# Patient Record
Sex: Male | Born: 1985 | Race: White | Hispanic: No | Marital: Single | Smoking: Never smoker
Health system: Southern US, Community
[De-identification: ages and names within clinical notes are randomized; demographics above are authoritative.]

---

## 2016-01-24 ENCOUNTER — Emergency Department: Payer: Managed Care, Other (non HMO)

## 2016-01-24 ENCOUNTER — Emergency Department
Admission: EM | Admit: 2016-01-24 | Discharge: 2016-01-24 | Disposition: A | Discharge status: Home / Self Care | Payer: Managed Care, Other (non HMO) | Attending: Emergency Medicine | Admitting: Emergency Medicine

## 2016-01-24 DIAGNOSIS — R109 Unspecified abdominal pain: Secondary | ICD-10-CM | POA: Diagnosis not present

## 2016-01-24 LAB — URINALYSIS COMPLETE WITH MICROSCOPIC (ARMC ONLY)
BACTERIA UA: NONE SEEN
Bilirubin Urine: NEGATIVE
Glucose, UA: NEGATIVE mg/dL
Hgb urine dipstick: NEGATIVE
KETONES UR: NEGATIVE mg/dL
Leukocytes, UA: NEGATIVE
Nitrite: NEGATIVE
PROTEIN: NEGATIVE mg/dL
Specific Gravity, Urine: 1.009 (ref 1.005–1.030)
pH: 6 (ref 5.0–8.0)

## 2016-01-24 LAB — COMPREHENSIVE METABOLIC PANEL
ALT: 63 U/L (ref 17–63)
AST: 40 U/L (ref 15–41)
Albumin: 5.1 g/dL — ABNORMAL HIGH (ref 3.5–5.0)
Alkaline Phosphatase: 85 U/L (ref 38–126)
Anion gap: 8 (ref 5–15)
BILIRUBIN TOTAL: 1.8 mg/dL — AB (ref 0.3–1.2)
BUN: 13 mg/dL (ref 6–20)
CHLORIDE: 103 mmol/L (ref 101–111)
CO2: 26 mmol/L (ref 22–32)
CREATININE: 0.85 mg/dL (ref 0.61–1.24)
Calcium: 9.8 mg/dL (ref 8.9–10.3)
GFR calc Af Amer: >60 mL/min (ref >60)
Glucose, Bld: 87 mg/dL (ref 65–99)
Potassium: 3.8 mmol/L (ref 3.5–5.1)
Sodium: 137 mmol/L (ref 135–145)
Total Protein: 8.2 g/dL — ABNORMAL HIGH (ref 6.5–8.1)

## 2016-01-24 LAB — CBC
HEMATOCRIT: 44.6 % (ref 40.0–52.0)
Hemoglobin: 15.5 g/dL (ref 13.0–18.0)
MCH: 30.9 pg (ref 26.0–34.0)
MCHC: 34.9 g/dL (ref 32.0–36.0)
MCV: 88.5 fL (ref 80.0–100.0)
Platelets: 285 10*3/uL (ref 150–440)
RBC: 5.04 MIL/uL (ref 4.40–5.90)
RDW: 12.9 % (ref 11.5–14.5)
WBC: 7.8 10*3/uL (ref 3.8–10.6)

## 2016-01-24 LAB — LIPASE, BLOOD: LIPASE: 23 U/L (ref 11–51)

## 2016-01-24 MED ORDER — TRAMADOL HCL 50 MG PO TABS: 50.0000 mg | ORAL_TABLET | Freq: Four times a day (QID) | ORAL | Status: DC | PRN

## 2016-01-24 MED ORDER — CYCLOBENZAPRINE HCL 10 MG PO TABS: 10.0000 mg | ORAL_TABLET | Freq: Three times a day (TID) | ORAL | Status: DC | PRN

## 2016-01-24 NOTE — ED Notes (Signed)
Pt states R sided chest pain x 2 years, went away and came back Thursday. States it feels "like someone stabbed me in the chest". States R sided abdominal pain. Denies radiation. States when pain increases he becomes nauseous, but denies fevers, vomiting, or diarrhea. Pt is alert and oriented, skin warm and dry.

## 2016-01-24 NOTE — ED Notes (Signed)
Pt in with co right sided abd pain and right breast pain. States started Thursday, breast pain is worse on palpation and on movement. States has had denies any n.v.d but does have dark colored urine.

## 2016-01-24 NOTE — ED Provider Notes (Signed)
[facility] Emergency Department Provider Note        Time seen: ----------------------------------------- 8:39 PM on 01/24/2016 -----------------------------------------    I have reviewed the triage vital signs and the nursing notes.   HISTORY  Chief Complaint Abdominal Pain    HPI Jon Cooper is a 30 y.o. male who presents to ER for right-sided chest and abdominal pain. Patient states pain is intermittent, feels like someone stabbed him on the right side. He states the pain increased to becomes nauseous, denies fevers, vomiting or diarrhea. Patient states he does have dark-colored urine.   No past medical history on file.  There are no active problems to display for this patient.   No past surgical history on file.  Allergies Review of patient's allergies indicates no known allergies.  Social History Social History  Substance Use Topics  . Smoking status: Not on file  . Smokeless tobacco: Not on file  . Alcohol Use: Not on file    Review of Systems Constitutional: Negative for fever. Cardiovascular: Positive chest pain Respiratory: Negative for shortness of breath. Gastrointestinal: Positive for abdominal pain Genitourinary: Negative for dysuria. Musculoskeletal: Negative for back pain. Skin: Negative for rash. Neurological: Negative for headaches, focal weakness or numbness.  10-point ROS otherwise negative.  ____________________________________________   PHYSICAL EXAM:  VITAL SIGNS: ED Triage Vitals  Enc Vitals Group     BP 01/24/16 1929 154/82 mmHg     Pulse Rate 01/24/16 1928 76     Resp 01/24/16 1928 18     Temp 01/24/16 1928 98 F (36.7 C)     Temp Source 01/24/16 1928 Oral     SpO2 01/24/16 1928 97 %     Weight 01/24/16 1928 198 lb (89.812 kg)     Height 01/24/16 1928 5\' 8"  (1.727 m)     Head Cir --      Peak Flow --      Pain Score 01/24/16 1928 5     Pain Loc --      Pain Edu? --      Excl. in GC? --      Constitutional: Alert and oriented. Well appearing and in no distress. Eyes: Conjunctivae are normal. PERRL. Normal extraocular movements. ENT   Head: Normocephalic and atraumatic.   Nose: No congestion/rhinnorhea.   Mouth/Throat: Mucous membranes are moist.   Neck: No stridor. Cardiovascular: Normal rate, regular rhythm. No murmurs, rubs, or gallops. Respiratory: Normal respiratory effort without tachypnea nor retractions. Breath sounds are clear and equal bilaterally. No wheezes/rales/rhonchi. Gastrointestinal: Soft and nontender. Normal bowel sounds Musculoskeletal: Nontender with normal range of motion in all extremities. No lower extremity tenderness nor edema. Neurologic:  Normal speech and language. No gross focal neurologic deficits are appreciated.  Skin:  Skin is warm, dry and intact. No rash noted. Psychiatric: Mood and affect are normal. Speech and behavior are normal.  ____________________________________________  ED COURSE:  Pertinent labs & imaging results that were available during my care of the patient were reviewed by me and considered in my medical decision making (see chart for details). Patient is in no acute distress, will check basic labs and imaging. ____________________________________________    LABS (pertinent positives/negatives)  Labs Reviewed  COMPREHENSIVE METABOLIC PANEL - Abnormal; Notable for the following:    Total Protein 8.2 (*)    Albumin 5.1 (*)    Total Bilirubin 1.8 (*)    All other components within normal limits  URINALYSIS COMPLETEWITH MICROSCOPIC ([facility] ONLY) - Abnormal; Notable for the  following:    Color, Urine STRAW (*)    APPearance CLEAR (*)    Squamous Epithelial / LPF 0-5 (*)    All other components within normal limits  CBC  LIPASE, BLOOD    RADIOLOGY Images were viewed by me  CT renal protocol IMPRESSION: 1. No CT evidence for nephrolithiasis or obstructive uropathy. 2. No other acute  intra-abdominal or pelvic process. Normal appendix. ____________________________________________  FINAL ASSESSMENT AND PLAN  Flank pain  Plan: Patient with labs and imaging as dictated above. Patient with acute on chronic flank pain of uncertain etiology. I will prescribe tramadol and Flexeril, advised close follow-up with his doctor for reevaluation.   Emily FilbertWilliams, Jonathan E, MD   Note: This dictation was prepared with Dragon dictation. Any transcriptional errors that result from this process are unintentional   Emily FilbertJonathan E Williams, MD 01/24/16 2132

## 2016-01-24 NOTE — ED Notes (Signed)
Pt discharged to home.  Discharge instructions reviewed.  Verbalized understanding.  No questions or concerns at this time.  Teach back verified.  Pt in NAD.  No items left in ED.   

## 2016-01-24 NOTE — ED Notes (Signed)
Pt ambulatory from CT, back in room now.

## 2016-01-24 NOTE — Discharge Instructions (Signed)
Flank Pain °Flank pain refers to pain that is located on the side of the body between the upper abdomen and the back. The pain may occur over a short period of time (acute) or may be long-term or reoccurring (chronic). It may be mild or severe. Flank pain can be caused by many things. °CAUSES  °Some of the more common causes of flank pain include: °· Muscle strains.   °· Muscle spasms.   °· A disease of your spine (vertebral disk disease).   °· A lung infection (pneumonia).   °· Fluid around your lungs (pulmonary edema).   °· A kidney infection.   °· Kidney stones.   °· A very painful skin rash caused by the chickenpox virus (shingles).   °· Gallbladder disease.   °HOME CARE INSTRUCTIONS  °Home care will depend on the cause of your pain. In general, °· Rest as directed by your caregiver. °· Drink enough fluids to keep your urine clear or pale yellow. °· Only take over-the-counter or prescription medicines as directed by your caregiver. Some medicines may help relieve the pain. °· Tell your caregiver about any changes in your pain. °· Follow up with your caregiver as directed. °SEEK IMMEDIATE MEDICAL CARE IF:  °· Your pain is not controlled with medicine.   °· You have new or worsening symptoms. °· Your pain increases.   °· You have abdominal pain.   °· You have shortness of breath.   °· You have persistent nausea or vomiting.   °· You have swelling in your abdomen.   °· You feel faint or pass out.   °· You have blood in your urine. °· You have a fever or persistent symptoms for more than 2-3 days. °· You have a fever and your symptoms suddenly get worse. °MAKE SURE YOU:  °· Understand these instructions. °· Will watch your condition. °· Will get help right away if you are not doing well or get worse. °  °This information is not intended to replace advice given to you by your health care provider. Make sure you discuss any questions you have with your health care provider. °  °Document Released: 08/29/2005 Document  Revised: 04/01/2012 Document Reviewed: 02/20/2012 °Elsevier Interactive Patient Education ©2016 [employer] ° °

## 2016-02-13 ENCOUNTER — Ambulatory Visit: Payer: Self-pay | Admitting: Family Medicine

## 2016-05-28 ENCOUNTER — Emergency Department: Payer: Managed Care, Other (non HMO)

## 2016-05-28 ENCOUNTER — Encounter: Payer: Self-pay | Admitting: Emergency Medicine

## 2016-05-28 ENCOUNTER — Inpatient Hospital Stay
Admission: EM | Admit: 2016-05-28 | Discharge: 2016-05-31 | DRG: 392 | Disposition: A | Discharge status: Home / Self Care | Payer: Managed Care, Other (non HMO) | Attending: Internal Medicine | Admitting: Internal Medicine

## 2016-05-28 DIAGNOSIS — K567 Ileus, unspecified: Secondary | ICD-10-CM | POA: Diagnosis present

## 2016-05-28 DIAGNOSIS — R109 Unspecified abdominal pain: Secondary | ICD-10-CM

## 2016-05-28 DIAGNOSIS — Z6832 Body mass index (BMI) 32.0-32.9, adult: Secondary | ICD-10-CM | POA: Diagnosis not present

## 2016-05-28 DIAGNOSIS — R1011 Right upper quadrant pain: Secondary | ICD-10-CM | POA: Diagnosis present

## 2016-05-28 DIAGNOSIS — K760 Fatty (change of) liver, not elsewhere classified: Secondary | ICD-10-CM | POA: Diagnosis present

## 2016-05-28 DIAGNOSIS — E669 Obesity, unspecified: Secondary | ICD-10-CM | POA: Diagnosis present

## 2016-05-28 DIAGNOSIS — R740 Nonspecific elevation of levels of transaminase and lactic acid dehydrogenase [LDH]: Secondary | ICD-10-CM | POA: Diagnosis present

## 2016-05-28 DIAGNOSIS — R55 Syncope and collapse: Secondary | ICD-10-CM | POA: Diagnosis present

## 2016-05-28 DIAGNOSIS — K56609 Unspecified intestinal obstruction, unspecified as to partial versus complete obstruction: Secondary | ICD-10-CM | POA: Diagnosis present

## 2016-05-28 DIAGNOSIS — R0789 Other chest pain: Secondary | ICD-10-CM | POA: Diagnosis present

## 2016-05-28 LAB — HEPATIC FUNCTION PANEL
ALBUMIN: 4.6 g/dL (ref 3.5–5.0)
ALK PHOS: 87 U/L (ref 38–126)
ALT: 126 U/L — ABNORMAL HIGH (ref 17–63)
AST: 68 U/L — AB (ref 15–41)
BILIRUBIN TOTAL: 1.3 mg/dL — AB (ref 0.3–1.2)
Bilirubin, Direct: <0.1 mg/dL — ABNORMAL LOW (ref 0.1–0.5)
Total Protein: 7.5 g/dL (ref 6.5–8.1)

## 2016-05-28 LAB — URINALYSIS COMPLETE WITH MICROSCOPIC (ARMC ONLY)
Bacteria, UA: NONE SEEN
Bilirubin Urine: NEGATIVE
Glucose, UA: NEGATIVE mg/dL
Hgb urine dipstick: NEGATIVE
Ketones, ur: NEGATIVE mg/dL
Leukocytes, UA: NEGATIVE
Nitrite: NEGATIVE
PROTEIN: NEGATIVE mg/dL
Specific Gravity, Urine: 1.009 (ref 1.005–1.030)
pH: 6 (ref 5.0–8.0)

## 2016-05-28 LAB — CBC
HEMATOCRIT: 44 % (ref 40.0–52.0)
Hemoglobin: 15.4 g/dL (ref 13.0–18.0)
MCH: 30.6 pg (ref 26.0–34.0)
MCHC: 35 g/dL (ref 32.0–36.0)
MCV: 87.5 fL (ref 80.0–100.0)
Platelets: 287 10*3/uL (ref 150–440)
RBC: 5.03 MIL/uL (ref 4.40–5.90)
RDW: 12.9 % (ref 11.5–14.5)
WBC: 5.8 10*3/uL (ref 3.8–10.6)

## 2016-05-28 LAB — BASIC METABOLIC PANEL
Anion gap: 7 (ref 5–15)
BUN: 13 mg/dL (ref 6–20)
CHLORIDE: 103 mmol/L (ref 101–111)
CO2: 27 mmol/L (ref 22–32)
Calcium: 9.4 mg/dL (ref 8.9–10.3)
Creatinine, Ser: 0.76 mg/dL (ref 0.61–1.24)
GFR calc Af Amer: >60 mL/min (ref >60)
GFR calc non Af Amer: >60 mL/min (ref >60)
Glucose, Bld: 122 mg/dL — ABNORMAL HIGH (ref 65–99)
POTASSIUM: 3.9 mmol/L (ref 3.5–5.1)
SODIUM: 137 mmol/L (ref 135–145)

## 2016-05-28 LAB — LIPASE, BLOOD: Lipase: 27 U/L (ref 11–51)

## 2016-05-28 LAB — FIBRIN DERIVATIVES D-DIMER (ARMC ONLY): FIBRIN DERIVATIVES D-DIMER (ARMC): 203 (ref 0–499)

## 2016-05-28 LAB — TROPONIN I: Troponin I: <0.03 ng/mL (ref <0.03)

## 2016-05-28 MED ORDER — KCL IN DEXTROSE-NACL 20-5-0.9 MEQ/L-%-% IV SOLN
INTRAVENOUS | Status: DC
  Administered 2016-05-28 – 2016-05-30 (×6): via INTRAVENOUS
  Filled 2016-05-28 (×9): qty 1000

## 2016-05-28 MED ORDER — ENOXAPARIN SODIUM 40 MG/0.4ML ~~LOC~~ SOLN
40.0000 mg | SUBCUTANEOUS | Status: DC
  Administered 2016-05-28 – 2016-05-30 (×3): 40 mg via SUBCUTANEOUS
  Filled 2016-05-28 (×3): qty 0.4

## 2016-05-28 MED ORDER — ACETAMINOPHEN 325 MG PO TABS
650.0000 mg | ORAL_TABLET | Freq: Four times a day (QID) | ORAL | Status: DC | PRN
  Administered 2016-05-29 – 2016-05-30 (×2): 650 mg via ORAL
  Filled 2016-05-28 (×2): qty 2

## 2016-05-28 MED ORDER — ONDANSETRON HCL 4 MG PO TABS: 4.0000 mg | ORAL_TABLET | Freq: Four times a day (QID) | ORAL | Status: DC | PRN

## 2016-05-28 MED ORDER — IOPAMIDOL (ISOVUE-300) INJECTION 61%
100.0000 mL | Freq: Once | INTRAVENOUS | Status: AC | PRN
  Administered 2016-05-28: 100 mL via INTRAVENOUS

## 2016-05-28 MED ORDER — MORPHINE SULFATE (PF) 2 MG/ML IV SOLN
4.0000 mg | Freq: Once | INTRAVENOUS | Status: AC
  Administered 2016-05-28: 4 mg via INTRAVENOUS
  Filled 2016-05-28: qty 2

## 2016-05-28 MED ORDER — KETOROLAC TROMETHAMINE 30 MG/ML IJ SOLN
30.0000 mg | Freq: Four times a day (QID) | INTRAMUSCULAR | Status: DC | PRN
  Administered 2016-05-28 – 2016-05-29 (×3): 30 mg via INTRAVENOUS
  Filled 2016-05-28 (×3): qty 1

## 2016-05-28 MED ORDER — SODIUM CHLORIDE 0.9 % IV BOLUS (SEPSIS)
1000.0000 mL | Freq: Once | INTRAVENOUS | Status: AC
  Administered 2016-05-28: 1000 mL via INTRAVENOUS

## 2016-05-28 MED ORDER — FAMOTIDINE IN NACL 20-0.9 MG/50ML-% IV SOLN
20.0000 mg | INTRAVENOUS | Status: DC
  Administered 2016-05-28 – 2016-05-30 (×3): 20 mg via INTRAVENOUS
  Filled 2016-05-28 (×4): qty 50

## 2016-05-28 MED ORDER — IOPAMIDOL (ISOVUE-300) INJECTION 61%
30.0000 mL | Freq: Once | INTRAVENOUS | Status: AC | PRN
  Administered 2016-05-28: 30 mL via ORAL

## 2016-05-28 MED ORDER — ONDANSETRON HCL 4 MG/2ML IJ SOLN
4.0000 mg | Freq: Once | INTRAMUSCULAR | Status: AC
  Administered 2016-05-28: 4 mg via INTRAVENOUS
  Filled 2016-05-28: qty 2

## 2016-05-28 MED ORDER — ACETAMINOPHEN 650 MG RE SUPP: 650.0000 mg | Freq: Four times a day (QID) | RECTAL | Status: DC | PRN

## 2016-05-28 MED ORDER — ONDANSETRON HCL 4 MG/2ML IJ SOLN: 4.0000 mg | Freq: Four times a day (QID) | INTRAMUSCULAR | Status: DC | PRN

## 2016-05-28 NOTE — ED Notes (Signed)
Pt reports right side chest pain for 1 year.  No n/v/d.  Pt reports intermittent sob.  No cough .  No fever.  Nonsmoker   Pt alert.  md at bedside on arrival.  nsr on monitor.

## 2016-05-28 NOTE — ED Notes (Signed)
Pt resting quietly   Chest pain improved.  Pt alert.  Skin warm and dry.  Iv in place.  nsr on monitor.

## 2016-05-28 NOTE — ED Notes (Signed)
Report called to [facility] 2nd floor.

## 2016-05-28 NOTE — H&P (Signed)
[facility] -  at [facility]   PATIENT NAME: Jon HaleJohn Cooper    MR#:  [id number]  DATE OF BIRTH:  [birth year]  DATE OF ADMISSION:  05/28/2016  PRIMARY CARE PHYSICIAN: No primary care provider on file.   REQUESTING/REFERRING PHYSICIAN: Nita Sicklearolina Veronese, MD  CHIEF COMPLAINT:  Acute rt abd pain and nausea  HISTORY OF PRESENT ILLNESS:  Jon HaleJohn Cooper  is a 30 y.o. male with a  history of No medical problems is presenting to the ED with a chief complaint of right-sided abdominal pain for the past one week associated with nausea. Denies any vomiting. Denies any diarrhea or constipation. The pain is 8-9 out of 10 with radiation to rt chest. Patient also reports that he has chronic right-sided abdominal pain for many years that has gotten worse in the past one week. Denies any fever or sick contacts. The patient reports that pain is so severe, he passed out once last week. He was seen by his primary care physician who referred him to gastroenterology and had endoscopy which was Negative. Patient also had a right upper quadrant ultrasound last Friday. Patient came into the emergency department CT abdomen has revealed borderline dilated distal small bowel loops within the central upper pelvis with questionable transition to decompressed distal small bowel, early or developing small bowel obstruction cannot be ruled out. Patient's last bowel movement is today a.m. denies any blood in stool  PAST MEDICAL HISTORY:  History reviewed. No pertinent past medical history.  PAST SURGICAL HISTOIRY:  History reviewed. No pertinent surgical history.  SOCIAL HISTORY:   Social History  Substance Use Topics  . Smoking status: Never Smoker  . Smokeless tobacco: Never Used  . Alcohol use No    FAMILY HISTORY:  No family history on file.  DRUG ALLERGIES:  No Known Allergies  REVIEW OF SYSTEMS:  CONSTITUTIONAL: No fever, fatigue or weakness.  EYES: No blurred or double  vision.  EARS, NOSE, AND THROAT: No tinnitus or ear pain.  RESPIRATORY: No cough, shortness of breath, wheezing or hemoptysis.  CARDIOVASCULAR: No chest pain, orthopnea, edema. Reporting dizziness when he has severe abdominal pain GASTROINTESTINAL: Reporting right-sided abdominal pain and nausea, denies vomiting, had a diarrhea last week which is resolved .  GENITOURINARY: No dysuria, hematuria.  ENDOCRINE: No polyuria, nocturia,  HEMATOLOGY: No anemia, easy bruising or bleeding SKIN: No rash or lesion. MUSCULOSKELETAL: No joint pain or arthritis.   NEUROLOGIC: No tingling, numbness, weakness.  PSYCHIATRY: No anxiety or depression.   MEDICATIONS AT HOME:   Prior to Admission medications   Medication Sig Start Date End Date Taking? Authorizing Provider  omeprazole (PRILOSEC) 40 MG capsule Take 40 mg by mouth daily.   Yes Historical Provider, MD  sucralfate (CARAFATE) 1 g tablet Take 1 g by mouth 4 (four) times daily.   Yes Historical Provider, MD      VITAL SIGNS:  Blood pressure (!) 142/87, pulse 64, temperature 98.2 F (36.8 C), temperature source Oral, resp. rate 19, height 5\' 8"  (1.727 m), weight 90.7 kg (200 lb), SpO2 94 %.  PHYSICAL EXAMINATION:  GENERAL:  30 y.o.-year-old patient lying in the bed with no acute distress.  EYES: Pupils equal, round, reactive to light and accommodation. No scleral icterus. Extraocular muscles intact.  HEENT: Head atraumatic, normocephalic. Oropharynx and nasopharynx clear.  NECK:  Supple, no jugular venous distention. No thyroid enlargement, no tenderness.  LUNGS: Normal breath sounds bilaterally, no wheezing, rales,rhonchi or crepitation. No use of accessory muscles of respiration.  CARDIOVASCULAR: S1, S2 normal. No murmurs, rubs, or gallops.  ABDOMEN: Soft, right upper quadrant is tender, distended. Hypoactive Bowel sounds presen in 3 quadrants patient being is guarding on the right upper quadrant area. EXTREMITIES: No pedal edema, cyanosis, or  clubbing.  NEUROLOGIC: Cranial nerves II through XII are intact. Muscle strength 5/5 in all extremities. Sensation intact. Gait not checked.  PSYCHIATRIC: The patient is alert and oriented x 3.  SKIN: No obvious rash, lesion, or ulcer.   LABORATORY PANEL:   CBC  Recent Labs Lab 05/28/16 1312  WBC 5.8  HGB 15.4  HCT 44.0  PLT 287   ------------------------------------------------------------------------------------------------------------------  Chemistries   Recent Labs Lab 05/28/16 1312  NA 137  K 3.9  CL 103  CO2 27  GLUCOSE 122*  BUN 13  CREATININE 0.76  CALCIUM 9.4  AST 68*  ALT 126*  ALKPHOS 87  BILITOT 1.3*   ------------------------------------------------------------------------------------------------------------------  Cardiac Enzymes  Recent Labs Lab 05/28/16 1600  TROPONINI <0.03   ------------------------------------------------------------------------------------------------------------------  RADIOLOGY:  Dg Chest 2 View  Result Date: 05/28/2016 CLINICAL DATA:  Right-sided chest pain and shortness of breath for 2 months EXAM: CHEST  2 VIEW COMPARISON:  None. FINDINGS: The heart size and mediastinal contours are within normal limits. Both lungs are clear. The visualized skeletal structures are unremarkable. IMPRESSION: No active cardiopulmonary disease. Electronically Signed   By: Alcide Clever M.D.   On: 05/28/2016 13:52   Ct Abdomen Pelvis W Contrast  Result Date: 05/28/2016 CLINICAL DATA:  Right-sided chest pain to the right lower quadrant with nausea and dizziness EXAM: CT ABDOMEN AND PELVIS WITH CONTRAST TECHNIQUE: Multidetector CT imaging of the abdomen and pelvis was performed using the standard protocol following bolus administration of intravenous contrast. CONTRAST:  ISOVUE-300 IOPAMIDOL (ISOVUE-300) INJECTION 61% COMPARISON:  01/24/2016 FINDINGS: Lower chest: Visualized lung bases show no acute infiltrate or effusion. Hepatobiliary:  Mild decreased density of the liver suggests fatty change. No focal hepatic abnormality. Gallbladder is contracted. No biliary dilatation. Pancreas: Unremarkable. No pancreatic ductal dilatation or surrounding inflammatory changes. Spleen: Normal in size without focal abnormality. Adrenals/Urinary Tract: Adrenal glands are unremarkable. Kidneys are normal, without renal calculi, focal lesion, or hydronephrosis. Bladder is unremarkable. Stomach/Bowel: Stomach is unremarkable. Borderline dilated mid small bowel loops in the upper pelvis with questionable transition to decompressed distal small bowel, series 2, image number 55. Appendix is visualized and is normal. Vascular/Lymphatic: No significant vascular findings are present. No enlarged abdominal or pelvic lymph nodes. Reproductive: Prostate is unremarkable. Other: No free air or free fluid Musculoskeletal: No acute or significant osseous findings. IMPRESSION: 1. Borderline dilated distal small bowel loops within the central upper pelvis with questionable transition to decompressed distal small bowel, early or developing small bowel obstruction cannot be ruled out. 2. Fatty liver Electronically Signed   By: Jasmine Pang M.D.   On: 05/28/2016 17:38   US Abdomen Limited Ruq  Result Date: 05/28/2016 CLINICAL DATA:  Right upper quadrant pain EXAM: US ABDOMEN LIMITED - RIGHT UPPER QUADRANT COMPARISON:  CT 01/24/2016 FINDINGS: Gallbladder: No gallstones or wall thickening visualized. No sonographic Murphy sign noted by sonographer. Common bile duct: Diameter: 3 mm Liver: 1.8 cm area of decreased echogenicity adjacent to the gallbladder fossa may represent fatty sparing. The liver shows diffuse increased echogenicity compatible with fatty infiltration. IMPRESSION: Negative for gallstones Fatty liver with probable fatty sparing adjacent to the gallbladder fossa. Electronically Signed   By: Marlan Palau M.D.   On: 05/28/2016 16:03    EKG:  Orders placed or  performed during the hospital encounter of 05/28/16  . ED EKG within 10 minutes  . ED EKG within 10 minutes    IMPRESSION AND PLAN:   Jon Cooper  is a 30 y.o. male with a  history of No medical problems is presenting to the ED with a chief complaint of right-sided abdominal pain for the past one week associated with nausea. Denies any vomiting  Assessment and plan  #Acute on chronic right abdominal pain with transaminitis-could be post viral as patient had gastroenteritis last week CT abdomen with possible early small bowel obstruction Ultrasound of the abdomen with fatty liver, no gallstones Admit to MedSurg unit Nothing by mouth and IV fluids Provide pain management as needed ng tube placement for decompression as needed if patient starts vomiting -Surgery consult is placed -Supportive therapy, conservative management for now -Repeat a.m. labs  #Syncope-vasovagal syncope secondary to pain Patient reports dizzy spells and syncopal episode when he has severe abdominal pain   #Chest pain atypical -probably radiation from the abdominal pain  Troponins 2 are negative and ruled out acute MI Fibrin derivatives are  203  #Obesity and fatty liver Lifestyle changes recommended once patient is medically stable  GI prophylaxis with Pepcid   All the records are reviewed and case discussed with ED provider. Management plans discussed with the patient, and he is  in agreement.  CODE STATUS: fc, mom HCPOA  TOTAL TIME TAKING CARE OF THIS PATIENT: 41 minutes.   Note: This dictation was prepared with Dragon dictation along with smaller phrase technology. Any transcriptional errors that result from this process are unintentional.  Ramonita LabGouru, Shanedra Lave M.D on 05/28/2016 at 7:15 PM  Between 7am to 6pm - Pager - [phone]  After 6pm go to [email or web address] - password [email or web address] [facility]  Office  [phone]  CC: Primary care physician; No primary care provider on  file.

## 2016-05-28 NOTE — ED Provider Notes (Signed)
[facility] Emergency Department Provider Note  ____________________________________________  Time seen: Approximately 3:35 PM  I have reviewed the triage vital signs and the nursing notes.   HISTORY  Chief Complaint Chest Pain and Dizziness   HPI Jon Cooper is a 30 y.o. male no significant past medical history presents for evaluation of right sided abdominal pain. Patient has been having intermittent episodes of pain over the course of the last year. For the last week he reports that the pain is happening 4-5 times a day. He describes the pain as a sharp cramping sensation, located in his right abdomen, radiating to his right chest, pleuritic in nature, associated with nausea, diaphoresis, bloating, and shortness of breath. Patient reports that sometimes the pain is so severe that he has passed out. He reports he last passed out last week. Has been seen by his primary care doctor who refer him to GI specialist. He underwent an endoscopy which was negative. According to patient he underwent a right upper quadrant ultrasound on Friday however does not have the results of that test. Patient denies any personal or family history of ischemic heart disease, blood clots, recent travel or immobilization, leg pain or swelling, history of exogenous hormones, hemoptysis. Patient is not a smoker. He reports that the pain today has been present the whole day and is currently 8 out of 10 which prompted his visit to the ED. He also has had nausea but no vomiting. Had diarrhea early in the week but that has resolved.  History reviewed. No pertinent past medical history.  There are no active problems to display for this patient.   History reviewed. No pertinent surgical history.  Prior to Admission medications   Medication Sig Start Date End Date Taking? Authorizing Provider  cyclobenzaprine (FLEXERIL) 10 MG tablet Take 1 tablet (10 mg total) by mouth 3 (three) times daily as  needed for muscle spasms. 01/24/16   Emily FilbertJonathan E Williams, MD  traMADol (ULTRAM) 50 MG tablet Take 1 tablet (50 mg total) by mouth every 6 (six) hours as needed. 01/24/16 01/23/17  Emily FilbertJonathan E Williams, MD    Allergies Patient has no known allergies.  No family history on file.  Social History Social History  Substance Use Topics  . Smoking status: Never Smoker  . Smokeless tobacco: Never Used  . Alcohol use No    Review of Systems  Constitutional: Negative for fever. Eyes: Negative for visual changes. ENT: Negative for sore throat. Cardiovascular: Negative for chest pain. Respiratory: Negative for shortness of breath. Gastrointestinal: + RUQ abdominal pain and nausea, diarrhea. No vomiting  Genitourinary: Negative for dysuria. Musculoskeletal: Negative for back pain. Skin: Negative for rash. Neurological: Negative for headaches, weakness or numbness.  ____________________________________________   PHYSICAL EXAM:  VITAL SIGNS: ED Triage Vitals  Enc Vitals Group     BP 05/28/16 1315 (!) 154/79     Pulse Rate 05/28/16 1315 68     Resp 05/28/16 1315 16     Temp 05/28/16 1315 98.2 F (36.8 C)     Temp Source 05/28/16 1315 Oral     SpO2 05/28/16 1315 96 %     Weight 05/28/16 1307 198 lb (89.8 kg)     Height 05/28/16 1315 5\' 8"  (1.727 m)     Head Circumference --      Peak Flow --      Pain Score 05/28/16 1307 7     Pain Loc --      Pain Edu? --  Excl. in GC? --     Constitutional: Alert and oriented. Well appearing and in no apparent distress. HEENT:      Head: Normocephalic and atraumatic.         Eyes: Conjunctivae are normal. Sclera is non-icteric. EOMI. PERRL      Mouth/Throat: Mucous membranes are moist.       Neck: Supple with no signs of meningismus. Cardiovascular: Regular rate and rhythm. No murmurs, gallops, or rubs. 2+ symmetrical distal pulses are present in all extremities. No JVD. Respiratory: Normal respiratory effort. Lungs are clear to  auscultation bilaterally. No wheezes, crackles, or rhonchi.  Gastrointestinal: Soft, ttp over the RUQ with negative Murphy sign, non distended with positive bowel sounds. No rebound or guarding. Genitourinary: No CVA tenderness. Musculoskeletal: Nontender with normal range of motion in all extremities. No edema, cyanosis, or erythema of extremities. Neurologic: Normal speech and language. Face is symmetric. Moving all extremities. No gross focal neurologic deficits are appreciated. Skin: Skin is warm, dry and intact. No rash noted. Psychiatric: Mood and affect are normal. Speech and behavior are normal.  ____________________________________________   LABS (all labs ordered are listed, but only abnormal results are displayed)  Labs Reviewed  BASIC METABOLIC PANEL - Abnormal; Notable for the following:       Result Value   Glucose, Bld 122 (*)    All other components within normal limits  HEPATIC FUNCTION PANEL - Abnormal; Notable for the following:    AST 68 (*)    ALT 126 (*)    Total Bilirubin 1.3 (*)    Bilirubin, Direct <0.1 (*)    All other components within normal limits  CBC  TROPONIN I  LIPASE, BLOOD  FIBRIN DERIVATIVES D-DIMER ([facility] ONLY)  TROPONIN I  URINALYSIS COMPLETEWITH MICROSCOPIC ([facility] ONLY)   ____________________________________________  EKG  ED ECG REPORT I, Nita Sickle, the attending physician, personally viewed and interpreted this ECG. Normal sinus rhythm, rate of 63, normal intervals, normal axis, no ST elevations or depressions. Patient does have S1 QT T3 on his EKG. No prior for comparison  ____________________________________________  RADIOLOGY  CXR: negative  RUQ [place]: Negative for gallstones Fatty liver with probable fatty sparing adjacent to the gallbladder fossa.  CT a/p: 1. Borderline dilated distal small bowel loops within the central upper pelvis with questionable transition to decompressed distal small bowel, early or developing  small bowel obstruction cannot be ruled out. 2. Fatty liver ____________________________________________   PROCEDURES  Procedure(s) performed: None Procedures Critical Care performed:  None ____________________________________________   INITIAL IMPRESSION / ASSESSMENT AND PLAN / ED COURSE  30 y.o. male no significant past medical history presents for evaluation of right upper quadrant abdominal pain. Patient is well-appearing, in no distress, vital signs are within normal limits, patient has mild tenderness palpation on the right upper quadrant with no rebound or guarding, negative Murphy sign. Differential diagnoses including gallbladder pathology versus esophageal spasms versus peptic ulcer disease versus pancreatitis versus PE. Will give IV morphine, IV zofran, IV fluids. We'll check labs, right upper quadrant ultrasound and a d-dimer.  Clinical Course as of May 28 1752  Tue May 28, 2016  1747 CT concerning for possible early obstruction. Patient continues to have pain although reports that pain is improved with IV morphine. Will discuss with Hospitalist for admission for obs and bowel rest.  [CV]    Clinical Course User Index [CV] Nita Sickle, MD    Pertinent labs & imaging results that were available during my care of the  patient were reviewed by me and considered in my medical decision making (see chart for details).    ____________________________________________   FINAL CLINICAL IMPRESSION(S) / ED DIAGNOSES  Final diagnoses:  RUQ abdominal pain  SBO (small bowel obstruction)      NEW MEDICATIONS STARTED DURING THIS VISIT:  New Prescriptions   No medications on file     Note:  This document was prepared using Dragon voice recognition software and may include unintentional dictation errors.    Nita Sicklearolina Airik Goodlin, MD 05/28/16 1753

## 2016-05-28 NOTE — ED Triage Notes (Addendum)
Pt toe d with c/o right sided chest pain and dizziness that started about 30 min PTA.  Pt states pain has been intermittent for several weeks. Worse with movement.

## 2016-05-29 LAB — CBC
HCT: 40.4 % (ref 40.0–52.0)
Hemoglobin: 14.3 g/dL (ref 13.0–18.0)
MCH: 31 pg (ref 26.0–34.0)
MCHC: 35.4 g/dL (ref 32.0–36.0)
MCV: 87.6 fL (ref 80.0–100.0)
Platelets: 241 K/uL (ref 150–440)
RBC: 4.61 MIL/uL (ref 4.40–5.90)
RDW: 13.2 % (ref 11.5–14.5)
WBC: 5.6 K/uL (ref 3.8–10.6)

## 2016-05-29 LAB — COMPREHENSIVE METABOLIC PANEL
ALT: 103 U/L — ABNORMAL HIGH (ref 17–63)
ANION GAP: 4 — AB (ref 5–15)
AST: 54 U/L — AB (ref 15–41)
Albumin: 3.7 g/dL (ref 3.5–5.0)
Alkaline Phosphatase: 73 U/L (ref 38–126)
BILIRUBIN TOTAL: 1.3 mg/dL — AB (ref 0.3–1.2)
BUN: 12 mg/dL (ref 6–20)
CO2: 29 mmol/L (ref 22–32)
Calcium: 8.7 mg/dL — ABNORMAL LOW (ref 8.9–10.3)
Chloride: 109 mmol/L (ref 101–111)
Creatinine, Ser: 0.79 mg/dL (ref 0.61–1.24)
GFR calc Af Amer: >60 mL/min (ref >60)
Glucose, Bld: 107 mg/dL — ABNORMAL HIGH (ref 65–99)
POTASSIUM: 4.2 mmol/L (ref 3.5–5.1)
Sodium: 142 mmol/L (ref 135–145)
TOTAL PROTEIN: 6.2 g/dL — AB (ref 6.5–8.1)

## 2016-05-29 MED ORDER — KETOROLAC TROMETHAMINE 30 MG/ML IJ SOLN
30.0000 mg | Freq: Four times a day (QID) | INTRAMUSCULAR | Status: DC | PRN
  Administered 2016-05-29 – 2016-05-30 (×4): 30 mg via INTRAVENOUS
  Filled 2016-05-29 (×4): qty 1

## 2016-05-29 NOTE — Progress Notes (Signed)
[facility] [place] [facility]   PATIENT NAME: Jon Cooper    MR#:  [id number]  DATE OF BIRTH:  Apr 15, 1986  SUBJECTIVE:   Came in with ongoing abdominal pain in the right upper quadrant for few days, sharp and persistent REVIEW OF SYSTEMS:   Review of Systems  Constitutional: Negative for chills, fever and weight loss.  HENT: Negative for ear discharge, ear pain and nosebleeds.   Eyes: Negative for blurred vision, pain and discharge.  Respiratory: Negative for sputum production, shortness of breath, wheezing and stridor.   Cardiovascular: Negative for chest pain, palpitations, orthopnea and PND.  Gastrointestinal: Positive for abdominal pain and nausea. Negative for diarrhea and vomiting.  Genitourinary: Negative for frequency and urgency.  Musculoskeletal: Negative for back pain and joint pain.  Neurological: Negative for sensory change, speech change, focal weakness and weakness.  Psychiatric/Behavioral: Negative for depression and hallucinations. The patient is not nervous/anxious.    Tolerating Diet:npo Tolerating PT: ambulatory  DRUG ALLERGIES:  No Known Allergies  VITALS:  Blood pressure 118/66, pulse 67, temperature 98.3 F (36.8 C), temperature source Oral, resp. rate 18, height 5\' 8"  (1.727 m), weight 97.8 kg (215 lb 11.2 oz), SpO2 99 %.  PHYSICAL EXAMINATION:   Physical Exam  GENERAL:  30 y.o.-year-old patient lying in the bed with no acute distress.  EYES: Pupils equal, round, reactive to light and accommodation. No scleral icterus. Extraocular muscles intact.  HEENT: Head atraumatic, normocephalic. Oropharynx and nasopharynx clear.  NECK:  Supple, no jugular venous distention. No thyroid enlargement, no tenderness.  LUNGS: Normal breath sounds bilaterally, no wheezing, rales, rhonchi. No use of accessory muscles of respiration.  CARDIOVASCULAR: S1, S2 normal. No murmurs, rubs, or gallops.  ABDOMEN: Soft, nontender, nondistended.  Bowel sounds present. No organomegaly or mass.  EXTREMITIES: No cyanosis, clubbing or edema b/l.    NEUROLOGIC: Cranial nerves II through XII are intact. No focal Motor or sensory deficits b/l.   PSYCHIATRIC:  patient is alert and oriented x 3.  SKIN: No obvious rash, lesion, or ulcer.   LABORATORY PANEL:  CBC  Recent Labs Lab 05/29/16 0432  WBC 5.6  HGB 14.3  HCT 40.4  PLT 241    Chemistries   Recent Labs Lab 05/29/16 0432  NA 142  K 4.2  CL 109  CO2 29  GLUCOSE 107*  BUN 12  CREATININE 0.79  CALCIUM 8.7*  AST 54*  ALT 103*  ALKPHOS 73  BILITOT 1.3*   Cardiac Enzymes  Recent Labs Lab 05/28/16 1600  TROPONINI <0.03   RADIOLOGY:  Dg Chest 2 View  Result Date: 05/28/2016 CLINICAL DATA:  Right-sided chest pain and shortness of breath for 2 months EXAM: CHEST  2 VIEW COMPARISON:  None. FINDINGS: The heart size and mediastinal contours are within normal limits. Both lungs are clear. The visualized skeletal structures are unremarkable. IMPRESSION: No active cardiopulmonary disease. Electronically Signed   By: Mark  Lukens M.D.   On: 05/28/2016 13:52   Ct Abdomen Pelvis W Contrast  Result Date: 05/28/2016 CLINICAL DATA:  Right-sided chest pain to the right lower quadrant with nausea and dizziness EXAM: CT ABDOMEN AND PELVIS WITH CONTRAST TECHNIQUE: Multidetector CT imaging of the abdomen and pelvis was performed using the standard protocol following bolus administration of intravenous contrast. CONTRAST:  <MEASUREMEN15 [id number] E. M581-89<MEASUREMENT69 [address] [phone] 392 [id number]! IncOPAMIDOL (ISOVUE-300) INJECTION 61% COMPARISON:  01/24/2016 FINDINGS: Lower chest: Visualized lung bases show no acute infiltrate or effusion. Hepatobiliary: Mild decreased density of the liver suggests fatty change. No focal  hepatic abnormality. Gallbladder is contracted. No biliary dilatation. Pancreas: Unremarkable. No pancreatic ductal dilatation or surrounding inflammatory changes. Spleen: Normal in size without focal abnormality. Adrenals/Urinary  Tract: Adrenal glands are unremarkable. Kidneys are normal, without renal calculi, focal lesion, or hydronephrosis. Bladder is unremarkable. Stomach/Bowel: Stomach is unremarkable. Borderline dilated mid small bowel loops in the upper pelvis with questionable transition to decompressed distal small bowel, series 2, image number 55. Appendix is visualized and is normal. Vascular/Lymphatic: No significant vascular findings are present. No enlarged abdominal or pelvic lymph nodes. Reproductive: Prostate is unremarkable. Other: No free air or free fluid Musculoskeletal: No acute or significant osseous findings. IMPRESSION: 1. Borderline dilated distal small bowel loops within the central upper pelvis with questionable transition to decompressed distal small bowel, early or developing small bowel obstruction cannot be ruled out. 2. Fatty liver Electronically Signed   By: Jasmine PangKim  Fujinaga M.D.   On: 05/28/2016 17:38   [place] Abdomen Limited Ruq  Result Date: 05/28/2016 CLINICAL DATA:  Right upper quadrant pain EXAM: US ABDOMEN LIMITED - RIGHT UPPER QUADRANT COMPARISON:  CT 01/24/2016 FINDINGS: Gallbladder: No gallstones or wall thickening visualized. No sonographic Murphy sign noted by sonographer. Common bile duct: Diameter: 3 mm Liver: 1.8 cm area of decreased echogenicity adjacent to the gallbladder fossa may represent fatty sparing. The liver shows diffuse increased echogenicity compatible with fatty infiltration. IMPRESSION: Negative for gallstones Fatty liver with probable fatty sparing adjacent to the gallbladder fossa. Electronically Signed   By: Marlan Palauharles  Clark M.D.   On: 05/28/2016 16:03   ASSESSMENT AND PLAN:  Jon Cooper  is a 30 y.o. male with a  history of No medical problems is presenting to the ED with a chief complaint of right-sided abdominal pain for the past one week associated with nausea. Denies any vomiting  #Acute on chronic right abdominal pain with transaminitis- CT abdomen with possible  early small bowel obstruction Ultrasound of the abdomen with fatty liver, no gallstones Nothing by mouth and IV fluids Provide pain management as needed -Surgery consult is placed -Supportive therapy, conservative management for now -Repeat a.m. labs  #Syncope-vasovagal syncope secondary to pain Patient reports dizzy spells and syncopal episode when he has severe abdominal pain   #Chest pain atypical -probably radiation from the abdominal pain  Troponins 2 are negative and ruled out acute MI  #Obesity and fatty liver Lifestyle changes recommended once patient is medically stable  GI prophylaxis with Pepcid   Case discussed with Care Management/Social Worker. Management plans discussed with the patient, family and they are in agreement.  CODE STATUS: full  DVT Prophylaxis:lovenox  TOTAL TIME TAKING CARE OF THIS PATIENT: 30 minutes.  >50% time spent on counselling and coordination of care  POSSIBLE D/C IN 1-2 DAYS, DEPENDING ON CLINICAL CONDITION.  Note: This dictation was prepared with Dragon dictation along with smaller phrase technology. Any transcriptional errors that result from this process are unintentional.  Nikita Humble M.D on 05/29/2016 at 5:05 PM  Between 7am to 6pm - Pager - [phone]  After 6pm go to [email or web address] - password [email or web address] [email or web address]  [facility]  Office  [phone]  CC: Primary care physician; No primary care provider on file.

## 2016-05-29 NOTE — Progress Notes (Signed)
Pt awaiting procedue on issue he has been dealing with for a year. 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 CH is available.   05/29/16 1010  Clinical Encounter Type  Visited With Patient  Visit Type Initial  Referral From Nurse  Spiritual Encounters  Spiritual Needs Emotional  Stress Factors  Patient Stress Factors None identified

## 2016-05-30 ENCOUNTER — Encounter: Payer: Self-pay | Admitting: Radiology

## 2016-05-30 ENCOUNTER — Inpatient Hospital Stay: Payer: Managed Care, Other (non HMO)

## 2016-05-30 DIAGNOSIS — K56609 Unspecified intestinal obstruction, unspecified as to partial versus complete obstruction: Secondary | ICD-10-CM

## 2016-05-30 DIAGNOSIS — R1011 Right upper quadrant pain: Principal | ICD-10-CM

## 2016-05-30 MED ORDER — TECHNETIUM TC 99M MEBROFENIN IV KIT
5.0000 | PACK | Freq: Once | INTRAVENOUS | Status: AC | PRN
  Administered 2016-05-30: 4.775 via INTRAVENOUS

## 2016-05-30 NOTE — Progress Notes (Signed)
[age]yr old male with Right sided abdominal pain.  He states that is does not correlate with food or bowel movements.  He does state that it is worse with movement and is intermittent. HE denies any nasuea or vomiting today, he is tolerating clear liquids.   Vitals:   05/30/16 0900 05/30/16 1520  BP: 124/76 127/69  Pulse: (!) 56 71  Resp: 16 17  Temp: 97.8 F (36.6 C) 98.4 F (36.9 C)   I/O last 3 completed shifts: In: 3044.5 [I.V.:3044.5] Out: 250 [Urine:250] No intake/output data recorded.   PE:  Gen: NAD Res: no wheezing, no respiratory distress Cardio: RRR Abd: soft, tender in right side along the entire muscle, but more tender pinpoint with leg raise Ext: 2+ pulses, no edema  CBC Latest Ref Rng & Units 05/29/2016 05/28/2016 01/24/2016  WBC 3.8 - 10.6 K/uL 5.6 5.8 7.8  Hemoglobin 13.0 - 18.0 g/dL 81.114.3 91.415.4 78.215.5  Hematocrit 40.0 - 52.0 % 40.4 44.0 44.6  Platelets 150 - 440 K/uL 241 287 285   CMP Latest Ref Rng & Units 05/29/2016 05/28/2016 01/24/2016  Glucose 65 - 99 mg/dL 956(O107(H) 130(Q122(H) 87  BUN 6 - 20 mg/dL 12 13 13   Creatinine 0.61 - 1.24 mg/dL 6.570.79 8.460.76 9.620.85  Sodium 135 - 145 mmol/L 142 137 137  Potassium 3.5 - 5.1 mmol/L 4.2 3.9 3.8  Chloride 101 - 111 mmol/L 109 103 103  CO2 22 - 32 mmol/L 29 27 26   Calcium 8.9 - 10.3 mg/dL 9.5(M8.7(L) 9.4 9.8  Total Protein 6.5 - 8.1 g/dL 6.2(L) 7.5 8.2(H)  Total Bilirubin 0.3 - 1.2 mg/dL 8.4(X1.3(H) 1.3(H) 1.8(H)  Alkaline Phos 38 - 126 U/L 73 87 85  AST 15 - 41 U/L 54(H) 68(H) 40  ALT 17 - 63 U/L 103(H) 126(H) 63   HIDA:IMPRESSION: 1. Normal nuclear medicine hepatobiliary scan. 2. Low normal gallbladder ejection fraction of 39%.  A/P:  30 yr old with right sided abdominal pain.  I examined the patient along with Dr. Servando SnareWohl of GI, I do believe that his pain is musculoskeletal in nature.  Warm compresses, NSAID and flexeril along with rest will likely improve symptoms.  He had a EF of 39% from GB on HIDA scan which is within the normal constraints,  do not feel his symptoms are from the gallbladder.

## 2016-05-30 NOTE — Consult Note (Signed)
[name] [name], MD [facility]  [address] [place], [place] [address] Phone: [phone] Fax : [phone]  Consultation  Referring Provider:     No ref. provider found Primary Care Physician:  No primary care provider on file. Primary Gastroenterologist:          Reason for Consultation:     Right-sided abdominal pain  Date of Admission:  05/28/2016 Date of Consultation:  05/30/2016         HPI:   Jon HaleJohn Cooper is a 30 y.o. male who is admitted with abdominal pain that he states has been present for approximately 1 year.  The patient states that the abdominal pain was originally intermittent but now has become constant over the last month. The patient's abdominal pain is worse when he lays down.  He also states that the pain is not made any better or worse with eating or drinking.  He also states that his abdominal pain is not associated with moving his bowels.  There is no report of any unexplained weight loss.  The patient underwent an upper endoscopy for this abdominal pain which he states was negative.  He has also seen his primary care provider for this pain and he states that no one has been able to tell him what the pain is from.  There is no family history of colon cancer or colon polyps.  The patient did have a CT scan with questionable ileus vs early small bowel obstruction based on borderline dilated distal small bowel loops.  The patient states he is moving his bowels and passing gas.  History reviewed. No pertinent past medical history.  History reviewed. No pertinent surgical history.  Prior to Admission medications   Medication Sig Start Date End Date Taking? Authorizing Provider  omeprazole (PRILOSEC) 40 MG capsule Take 40 mg by mouth daily.   Yes Historical Provider, MD  sucralfate (CARAFATE) 1 g tablet Take 1 g by mouth 4 (four) times daily.   Yes Historical Provider, MD    No family history on file.   Social History  Substance Use Topics  . Smoking status: Never  Smoker  . Smokeless tobacco: Never Used  . Alcohol use No    Allergies as of 05/28/2016  . (No Known Allergies)    Review of Systems:    All systems reviewed and negative except where noted in HPI.   Physical Exam:  Vital signs in last 24 hours: Temp:  [97.6 F (36.4 C)-98.4 F (36.9 C)] 98.4 F (36.9 C) (11/09 2038) Pulse Rate:  [55-71] 63 (11/09 2038) Resp:  [16-20] 20 (11/09 2038) BP: (99-127)/(45-76) 120/72 (11/09 2038) SpO2:  [97 %-100 %] 97 % (11/09 2038) Last BM Date: 05/30/16 General:   Pleasant, cooperative in NAD Head:  Normocephalic and atraumatic. Eyes:   No icterus.   Conjunctiva pink. PERRLA. Ears:  Normal auditory acuity. Neck:  Supple; no masses or thyroidomegaly Lungs: Respirations even and unlabored. Lungs clear to auscultation bilaterally.   No wheezes, crackles, or rhonchi.  Heart:  Regular rate and rhythm;  Without murmur, clicks, rubs or gallops Abdomen:  Soft, nondistended, Positive tenderness to one finger palpation while flexing the abdominal wall muscles by raising the patient's legs 6 inches above the exam table. Normal bowel sounds. No appreciable masses or hepatomegaly.  No rebound or guarding.  Rectal:  Not performed. Msk:  Symmetrical without gross deformities.    Extremities:  Without edema, cyanosis or clubbing. Neurologic:  Alert and oriented x3;  grossly normal  neurologically. Skin:  Intact without significant lesions or rashes. Cervical Nodes:  No significant cervical adenopathy. Psych:  Alert and cooperative. Normal affect.  LAB RESULTS:  Recent Labs  05/28/16 1312 05/29/16 0432  WBC 5.8 5.6  HGB 15.4 14.3  HCT 44.0 40.4  PLT 287 241   BMET  Recent Labs  05/28/16 1312 05/29/16 0432  NA 137 142  K 3.9 4.2  CL 103 109  CO2 27 29  GLUCOSE 122* 107*  BUN 13 12  CREATININE 0.76 0.79  CALCIUM 9.4 8.7*   LFT  Recent Labs  05/28/16 1312 05/29/16 0432  PROT 7.5 6.2*  ALBUMIN 4.6 3.7  AST 68* 54*  ALT 126* 103*    ALKPHOS 87 73  BILITOT 1.3* 1.3*  BILIDIR <0.1*  --   IBILI NOT CALCULATED  --    PT/INR No results for input(s): LABPROT, INR in the last 72 hours.  STUDIES: Nm Hepato W/eject Fract  Result Date: 05/30/2016 CLINICAL DATA:  Abdominal pain intermittently over the last year, worsening recently, some nausea EXAM: NUCLEAR MEDICINE HEPATOBILIARY IMAGING WITH GALLBLADDER EF TECHNIQUE: Sequential images of the abdomen were obtained out to 60 minutes following intravenous administration of radiopharmaceutical. After oral ingestion of Ensure, gallbladder ejection fraction was determined. At 60 min, normal ejection fraction is greater than 33%. RADIOPHARMACEUTICALS:  4.78 mCi Tc-2536m  Choletec IV COMPARISON:  CT abdomen pelvis of 05/28/2016 FINDINGS: Prompt uptake and biliary excretion of activity by the liver is seen. Gallbladder activity is visualized, consistent with patency of cystic duct. Biliary activity passes into small bowel, consistent with patent common bile duct. Calculated gallbladder ejection fraction is 39%. (Normal gallbladder ejection fraction with Ensure is greater than 33%.) IMPRESSION: 1. Normal nuclear medicine hepatobiliary scan. 2. Low normal gallbladder ejection fraction of 39%. Electronically Signed   By: Dwyane DeePaul  Barry M.D.   On: 05/30/2016 14:48      Impression / Plan:   Jon HaleJohn Cooper is a 30 y.o. y/o male with right-sided abdominal pain.  The pain is reproducible with the patient performing a straight leg raise without palpating the abdomen and is reported to go from a 7 out of 10 to a 10 out of 10 with one finger palpation of the abdominal wall muscles.  The patient's abdominal pain is consistent with musculoskeletal pain and I recommend treating it as such.  No further GI workup is needed.  The patient has been explained the plan and agrees with it.  Thank you for involving me in the care of this patient.      LOS: 2 days   [name] Rayvin Abid, MD  05/30/2016, 9:13 PM   Note: This  dictation was prepared with Dragon dictation along with smaller phrase technology. Any transcriptional errors that result from this process are unintentional.

## 2016-05-30 NOTE — Consult Note (Signed)
Date of Consultation:  05/29/2016  Requesting Physician:  Ramonita LabAruna Gouru, MD  Reason for Consultation:  Abdominal pain, possible SBO  History of Present Illness: Jon Cooper is a 30 y.o. male admitted on 11/7 with a 5-day history of right sided abdominal pain associated with nausea.  Patient reports that he has had some degree of right-sided abdominal pain for about a year with different episodes of pain throughout the year, with more constant pain since 11/3.  Has had nausea but no emesis.  His pain has become severe and has actually had syncopal episode due to the pain severity.  Denies any food association with his pain episodes in the past and this admission.  Reports passing flatus and having bowel movements and denies constipation.  In the ED had workup showing normal WBC, total bilirubin of 1.3 which is decreased compared to 01/24/16, mild transaminitis, U/S showing no gallstones or wall thickening, and CT scan showing borderline dilated small bowel loops suggestive of early SBO.  With his year history of abdominal pain, there was suspicion for GERD and he was on carafate and prilosec with some symptomatic improvement but still has been having pain despite of these medications.  He had an EGD 3 weeks ago which showed findings suggestive of decreased peristaltic activity in esophageal body and a non-obstructing Schatzki's ring.  Stomach and duodenum were normal.  He has had CT scans in the past which have not shown any possible source for the patient's pain.    Past Medical History: History reviewed. No pertinent past medical history.   Past Surgical History: History reviewed. No pertinent surgical history.  Home Medications: Prior to Admission medications   Medication Sig Start Date End Date Taking? Authorizing Provider  omeprazole (PRILOSEC) 40 MG capsule Take 40 mg by mouth daily.   Yes Historical Provider, MD  sucralfate (CARAFATE) 1 g tablet Take 1 g by mouth 4 (four) times daily.   Yes  Historical Provider, MD    Allergies: No Known Allergies  Social History:  reports that he has never smoked. He has never used smokeless tobacco. He reports that he does not drink alcohol or use drugs.   Family History: No family history on file.  Review of Systems: Review of Systems  Constitutional: Negative for chills and fever.  HENT: Negative for hearing loss.   Eyes: Negative for blurred vision.  Respiratory: Negative for cough and shortness of breath.   Cardiovascular: Negative for chest pain.  Gastrointestinal: Positive for abdominal pain and nausea. Negative for blood in stool, constipation, diarrhea, heartburn and vomiting.  Genitourinary: Negative for dysuria and hematuria.  Musculoskeletal: Negative for myalgias.  Skin: Negative for rash.  Neurological: Negative for dizziness.  Psychiatric/Behavioral: Negative for depression.  All other systems reviewed and are negative.   Physical Exam BP 129/70 (BP Location: Right Arm)   Pulse 60   Temp 98.2 F (36.8 C) (Oral)   Resp 17   Ht 5\' 8"  (1.727 m)   Wt 97.8 kg (215 lb 11.2 oz)   SpO2 99%   BMI 32.80 kg/m  CONSTITUTIONAL: No acute distress. HEENT:  Normocephalic, atraumatic, extraocular motion intact. NECK: Trachea is midline, and there is no jugular venous distension.  LYMPH NODES:  Lymph nodes in the neck are not enlarged. RESPIRATORY:  Lungs are clear, and breath sounds are equal bilaterally. Normal respiratory effort without pathologic use of accessory muscles. CARDIOVASCULAR: Heart is regular without murmurs, gallops, or rubs. GI: The abdomen is soft, mild distended, mild tenderness over  right upper quadrant.  MUSCULOSKELETAL:  Normal muscle strength and tone in all four extremities.  No peripheral edema or cyanosis. SKIN: Skin turgor is normal. There are no pathologic skin lesions.  NEUROLOGIC:  Motor and sensation is grossly normal.  Cranial nerves are grossly intact. PSYCH:  Alert and oriented to person,  place and time. Affect is normal.  Laboratory Analysis: Results for orders placed or performed during the hospital encounter of 05/28/16 (from the past 24 hour(s))  Comprehensive metabolic panel     Status: Abnormal   Collection Time: 05/29/16  4:32 AM  Result Value Ref Range   Sodium 142 135 - 145 mmol/L   Potassium 4.2 3.5 - 5.1 mmol/L   Chloride 109 101 - 111 mmol/L   CO2 29 22 - 32 mmol/L   Glucose, Bld 107 (H) 65 - 99 mg/dL   BUN 12 6 - 20 mg/dL   Creatinine, Ser 1.610.79 0.61 - 1.24 mg/dL   Calcium 8.7 (L) 8.9 - 10.3 mg/dL   Total Protein 6.2 (L) 6.5 - 8.1 g/dL   Albumin 3.7 3.5 - 5.0 g/dL   AST 54 (H) 15 - 41 U/L   ALT 103 (H) 17 - 63 U/L   Alkaline Phosphatase 73 38 - 126 U/L   Total Bilirubin 1.3 (H) 0.3 - 1.2 mg/dL   GFR calc non Af Amer >60 >60 mL/min   GFR calc Af Amer >60 >60 mL/min   Anion gap 4 (L) 5 - 15  CBC     Status: None   Collection Time: 05/29/16  4:32 AM  Result Value Ref Range   WBC 5.6 3.8 - 10.6 K/uL   RBC 4.61 4.40 - 5.90 MIL/uL   Hemoglobin 14.3 13.0 - 18.0 g/dL   HCT 09.640.4 04.540.0 - 40.952.0 %   MCV 87.6 80.0 - 100.0 fL   MCH 31.0 26.0 - 34.0 pg   MCHC 35.4 32.0 - 36.0 g/dL   RDW 81.113.2 91.411.5 - 78.214.5 %   Platelets 241 150 - 440 K/uL    Imaging: No results found.  Assessment and Plan: This is a 30 y.o. male who presents with abdominal pain and possible early SBO on CT scan.  I have reviewed the patient's laboratory and imaging studies and have discussed them with the patient.  Overall it is unclear why the patient is having right sided abdominal pain.  He has no peptic ulcers, no cholelithiasis, and no other intra-abdominal pathology except for borderline dilated small bowel loops.  He has been having flatus and bowel movement and currently feels better on NPO status.  There is no acute surgical need and no convincing evidence for surgical pathology at this point.  Would recommend GI consult for further evaluation of the patient's abdominal pain.  Consider  HIDA scan to rule out cholecystitis or dyskinesia, though his symptoms are not typical for that.  In the meantime, continue NPO status today with IV fluid hydration.  Possible advance diet tomorrow based on his bowel function.   Howie IllJose Luis Raffi Milstein, MD [facility]

## 2016-05-30 NOTE — Progress Notes (Signed)
[facility] [place] [facility]   PATIENT NAME: Jon HaleJohn Cooper    MR#:  [id number]  DATE OF BIRTH:  1985/08/10  SUBJECTIVE:   Came in with ongoing abdominal pain in the right upper quadrant for few days, sharp and persistent REVIEW OF SYSTEMS:   Review of Systems  Constitutional: Negative for chills, fever and weight loss.  HENT: Negative for ear discharge, ear pain and nosebleeds.   Eyes: Negative for blurred vision, pain and discharge.  Respiratory: Negative for sputum production, shortness of breath, wheezing and stridor.   Cardiovascular: Negative for chest pain, palpitations, orthopnea and PND.  Gastrointestinal: Positive for abdominal pain and nausea. Negative for diarrhea and vomiting.  Genitourinary: Negative for frequency and urgency.  Musculoskeletal: Negative for back pain and joint pain.  Neurological: Negative for sensory change, speech change, focal weakness and weakness.  Psychiatric/Behavioral: Negative for depression and hallucinations. The patient is not nervous/anxious.    Tolerating Diet:npo Tolerating PT: ambulatory  DRUG ALLERGIES:  No Known Allergies  VITALS:  Blood pressure 127/69, pulse 71, temperature 98.4 F (36.9 C), temperature source Axillary, resp. rate 17, height 5\' 8"  (1.727 m), weight 97.8 kg (215 lb 11.2 oz), SpO2 100 %.  PHYSICAL EXAMINATION:   Physical Exam  GENERAL:  30 y.o.-year-old patient lying in the bed with no acute distress.  EYES: Pupils equal, round, reactive to light and accommodation. No scleral icterus. Extraocular muscles intact.  HEENT: Head atraumatic, normocephalic. Oropharynx and nasopharynx clear.  NECK:  Supple, no jugular venous distention. No thyroid enlargement, no tenderness.  LUNGS: Normal breath sounds bilaterally, no wheezing, rales, rhonchi. No use of accessory muscles of respiration.  CARDIOVASCULAR: S1, S2 normal. No murmurs, rubs, or gallops.  ABDOMEN: Soft, nontender,  nondistended. Bowel sounds present. No organomegaly or mass.  EXTREMITIES: No cyanosis, clubbing or edema b/l.    NEUROLOGIC: Cranial nerves II through XII are intact. No focal Motor or sensory deficits b/l.   PSYCHIATRIC:  patient is alert and oriented x 3.  SKIN: No obvious rash, lesion, or ulcer.   LABORATORY PANEL:  CBC  Recent Labs Lab 05/29/16 0432  WBC 5.6  HGB 14.3  HCT 40.4  PLT 241    Chemistries   Recent Labs Lab 05/29/16 0432  NA 142  K 4.2  CL 109  CO2 29  GLUCOSE 107*  BUN 12  CREATININE 0.79  CALCIUM 8.7*  AST 54*  ALT 103*  ALKPHOS 73  BILITOT 1.3*   Cardiac Enzymes  Recent Labs Lab 05/28/16 1600  TROPONINI <0.03   RADIOLOGY:  Nm Hepato W/eject Fract  Result Date: 05/30/2016 CLINICAL DATA:  Abdominal pain intermittently over the last year, worsening recently, some nausea EXAM: NUCLEAR MEDICINE HEPATOBILIARY IMAGING WITH GALLBLADDER EF TECHNIQUE: Sequential images of the abdomen were obtained out to 60 minutes following intravenous administration of radiopharmaceutical. After oral ingestion of Ensure, gallbladder ejection fraction was determined. At 60 min, normal ejection fraction is greater than 33%. RADIOPHARMACEUTICALS:  4.78 mCi Tc-3241m  Choletec IV COMPARISON:  CT abdomen pelvis of 05/28/2016 FINDINGS: Prompt uptake and biliary excretion of activity by the liver is seen. Gallbladder activity is visualized, consistent with patency of cystic duct. Biliary activity passes into small bowel, consistent with patent common bile duct. Calculated gallbladder ejection fraction is 39%. (Normal gallbladder ejection fraction with Ensure is greater than 33%.) IMPRESSION: 1. Normal nuclear medicine hepatobiliary scan. 2. Low normal gallbladder ejection fraction of 39%. Electronically Signed   By: Lucienne MinksPaul  Barry M.D.  On: 05/30/2016 14:48   ASSESSMENT AND PLAN:  Jon Cooper  is a 30 y.o. male with a  history of No medical problems is presenting to the ED with a  chief complaint of right-sided abdominal pain for the past one week associated with nausea. Denies any vomiting  #Acute on chronic right abdominal pain with transaminitis- CT abdomen with possible early small bowel obstruction Ultrasound of the abdomen with fatty liver, no gallstones CLD Provide pain management as needed -Surgery consult appreciated.  -HIDA scan today -Supportive therapy, conservative management for now  #Syncope-vasovagal syncope secondary to pain Patient reports dizzy spells and syncopal episode when he has severe abdominal pain  #Chest pain atypical -probably radiation from the abdominal pain  Troponins 2 are negative and ruled out acute MI  #Obesity and fatty liver Lifestyle changes recommended once patient is medically stable  GI prophylaxis with Pepcid   Case discussed with Care Management/Social Worker. Management plans discussed with the patient, family and they are in agreement.  CODE STATUS: full  DVT Prophylaxis:lovenox  TOTAL TIME TAKING CARE OF THIS PATIENT: 30 minutes.  >50% time spent on counselling and coordination of care  POSSIBLE D/C IN 1-2 DAYS, DEPENDING ON CLINICAL CONDITION.  Note: This dictation was prepared with Dragon dictation along with smaller phrase technology. Any transcriptional errors that result from this process are unintentional.  Sabiha Sura M.D on 05/30/2016 at 6:27 PM  Between 7am to 6pm - Pager - [phone]  After 6pm go to [email or web address] - password [email or web address] [facility]  [facility]  Office  [phone]  CC: Primary care physician; No primary care provider on file.

## 2016-05-31 MED ORDER — IBUPROFEN 400 MG PO TABS: 400.0000 mg | ORAL_TABLET | Freq: Three times a day (TID) | ORAL | 0 refills | Status: AC | PRN

## 2016-05-31 MED ORDER — CYCLOBENZAPRINE HCL 10 MG PO TABS: 5.0000 mg | ORAL_TABLET | Freq: Three times a day (TID) | ORAL | Status: DC | PRN

## 2016-05-31 MED ORDER — IBUPROFEN 400 MG PO TABS
400.0000 mg | ORAL_TABLET | Freq: Four times a day (QID) | ORAL | Status: DC | PRN
  Filled 2016-05-31: qty 1

## 2016-05-31 MED ORDER — CYCLOBENZAPRINE HCL 5 MG PO TABS: 5.0000 mg | ORAL_TABLET | Freq: Three times a day (TID) | ORAL | 0 refills | Status: AC | PRN

## 2016-05-31 NOTE — Discharge Summary (Signed)
[facility] [place] [facility]   PATIENT NAME: Jon Cooper    MR#:  [id number]  DATE OF BIRTH:  01/23/1986  DATE OF ADMISSION:  05/28/2016 ADMITTING PHYSICIAN: Ramonita LabAruna Gouru, MD  DATE OF DISCHARGE: 05/31/16  PRIMARY CARE PHYSICIAN: No primary care provider on file.    ADMISSION DIAGNOSIS:  SBO (small bowel obstruction) [K56.609] RUQ abdominal pain [R10.11]  DISCHARGE DIAGNOSIS:  Right Upper quadrant pain of unclear etiology. W/u so far negative  SECONDARY DIAGNOSIS:  History reviewed. No pertinent past medical history.  HOSPITAL COURSE:  Jon RaringJohn Hammonsis a 30 y.o. malewith a history of No medical problems is presenting to the ED with a chief complaint of right-sided abdominal pain for the past one week associated with nausea. Denies any vomiting  #Acute on chronic right abdominal pain with mild transaminitis- CT abdomen with possible early small bowel obstruction Ultrasound of the abdomen with fatty liver,no gallstones CLD---regular diet Provide pain management as needed -Surgery consult appreciated.  -HIDA scan negative -GI pinput noted. Thinks it is musculoskeletal -Supportive therapy,conservative management for now  #Syncope-vasovagal syncope secondary to pain Patient reports dizzy spells and syncopal episode when he has severe abdominal pain  #Chest pain atypical -probably radiation from the abdominal pain  Troponins 2 are negative and ruled out acute MI  #Obesity and fatty liver Lifestyle changes recommended once patient is medically stable  GI prophylaxis with Pepcid  overall stable D/c  hme  CONSULTS OBTAINED:  Treatment Team:  Gladis Riffleatherine L Loflin, MD [name] Wohl, MD  DRUG ALLERGIES:  No Known Allergies  DISCHARGE MEDICATIONS:   Current Discharge Medication List    START taking these medications   Details  cyclobenzaprine (FLEXERIL) 5 MG tablet Take 1 tablet (5 mg total) by mouth 3 (three) times daily as  needed for muscle spasms. Qty: 30 tablet, Refills: 0    ibuprofen (ADVIL,MOTRIN) 400 MG tablet Take 1 tablet (400 mg total) by mouth every 8 (eight) hours as needed for headache. Qty: 30 tablet, Refills: 0      CONTINUE these medications which have NOT CHANGED   Details  omeprazole (PRILOSEC) 40 MG capsule Take 40 mg by mouth daily.    sucralfate (CARAFATE) 1 g tablet Take 1 g by mouth 4 (four) times daily.        If you experience worsening of your admission symptoms, develop shortness of breath, life threatening emergency, suicidal or homicidal thoughts you must seek medical attention immediately by calling 911 or calling your MD immediately  if symptoms less severe.  You Must read complete instructions/literature along with all the possible adverse reactions/side effects for all the Medicines you take and that have been prescribed to you. Take any new Medicines after you have completely understood and accept all the possible adverse reactions/side effects.   Please note  You were cared for by a hospitalist during your hospital stay. If you have any questions about your discharge medications or the care you received while you were in the hospital after you are discharged, you can call the unit and asked to speak with the hospitalist on call if the hospitalist that took care of you is not available. Once you are discharged, your primary care physician will handle any further medical issues. Please note that NO REFILLS for any discharge medications will be authorized once you are discharged, as it is imperative that you return to your primary care physician (or establish a relationship with a primary care physician if you do not  have one) for your aftercare needs so that they can reassess your need for medications and monitor your lab values. Today   SUBJECTIVE   Doing well  VITAL SIGNS:  Blood pressure (!) 107/58, pulse 71, temperature 97.6 F (36.4 C), temperature source Oral, resp.  rate 20, height 5\' 8"  (1.727 m), weight 97.8 kg (215 lb 11.2 oz), SpO2 97 %.  I/O:   Intake/Output Summary (Last 24 hours) at 05/31/16 0913 Last data filed at 05/31/16 0844  Gross per 24 hour  Intake          1814.97 ml  Output              200 ml  Net          1614.97 ml    PHYSICAL EXAMINATION:  GENERAL:  30 y.o.-year-old patient lying in the bed with no acute distress.  EYES: Pupils equal, round, reactive to light and accommodation. No scleral icterus. Extraocular muscles intact.  HEENT: Head atraumatic, normocephalic. Oropharynx and nasopharynx clear.  NECK:  Supple, no jugular venous distention. No thyroid enlargement, no tenderness.  LUNGS: Normal breath sounds bilaterally, no wheezing, rales,rhonchi or crepitation. No use of accessory muscles of respiration.  CARDIOVASCULAR: S1, S2 normal. No murmurs, rubs, or gallops.  ABDOMEN: Soft, non-tender, non-distended. Bowel sounds present. No organomegaly or mass.  EXTREMITIES: No pedal edema, cyanosis, or clubbing.  NEUROLOGIC: Cranial nerves II through XII are intact. Muscle strength 5/5 in all extremities. Sensation intact. Gait not checked.  PSYCHIATRIC: The patient is alert and oriented x 3.  SKIN: No obvious rash, lesion, or ulcer.   DATA REVIEW:   CBC   Recent Labs Lab 05/29/16 0432  WBC 5.6  HGB 14.3  HCT 40.4  PLT 241    Chemistries   Recent Labs Lab 05/29/16 0432  NA 142  K 4.2  CL 109  CO2 29  GLUCOSE 107*  BUN 12  CREATININE 0.79  CALCIUM 8.7*  AST 54*  ALT 103*  ALKPHOS 73  BILITOT 1.3*    Microbiology Results   No results found for this or any previous visit (from the past 240 hour(s)).  RADIOLOGY:  Nm Hepato W/eject Fract  Result Date: 05/30/2016 CLINICAL DATA:  Abdominal pain intermittently over the last year, worsening recently, some nausea EXAM: NUCLEAR MEDICINE HEPATOBILIARY IMAGING WITH GALLBLADDER EF TECHNIQUE: Sequential images of the abdomen were obtained out to 60 minutes  following intravenous administration of radiopharmaceutical. After oral ingestion of Ensure, gallbladder ejection fraction was determined. At 60 min, normal ejection fraction is greater than 33%. RADIOPHARMACEUTICALS:  4.78 mCi Tc-5530m  Choletec IV COMPARISON:  CT abdomen pelvis of 05/28/2016 FINDINGS: Prompt uptake and biliary excretion of activity by the liver is seen. Gallbladder activity is visualized, consistent with patency of cystic duct. Biliary activity passes into small bowel, consistent with patent common bile duct. Calculated gallbladder ejection fraction is 39%. (Normal gallbladder ejection fraction with Ensure is greater than 33%.) IMPRESSION: 1. Normal nuclear medicine hepatobiliary scan. 2. Low normal gallbladder ejection fraction of 39%. Electronically Signed   By: Dwyane DeePaul  Barry M.D.   On: 05/30/2016 14:48     Management plans discussed with the patient, family and they are in agreement.  CODE STATUS:     Code Status Orders        Start     Ordered   05/28/16 2013  Full code  Continuous     05/28/16 2012    Code Status History    Date Active Date  Inactive Code Status Order ID Comments User Context   This patient has a current code status but no historical code status.      TOTAL TIME TAKING CARE OF THIS PATIENT: 40 minutes.    Ayaka Andes M.D on 05/31/2016 at 9:13 AM  Between 7am to 6pm - Pager - [phone] After 6pm go to [email or web address] - password [email or web address] [facility]  [place] [facility]  Office  [phone]  CC: Primary care physician; No primary care provider on file.

## 2016-05-31 NOTE — Discharge Instructions (Signed)
° °  Abdominal Pain, Adult Many things can cause belly (abdominal) pain. Most times, the belly pain is not dangerous. Many cases of belly pain can be watched and treated at home. HOME CARE   Do not take medicines that help you go poop (laxatives) unless told to by your doctor.  Only take medicine as told by your doctor.  Eat or drink as told by your doctor. Your doctor will tell you if you should be on a special diet. GET HELP IF:  You do not know what is causing your belly pain.  You have belly pain while you are sick to your stomach (nauseous) or have runny poop (diarrhea).  You have pain while you pee or poop.  Your belly pain wakes you up at night.  You have belly pain that gets worse or better when you eat.  You have belly pain that gets worse when you eat fatty foods.  You have a fever. GET HELP RIGHT AWAY IF:   The pain does not go away within 2 hours.  You keep throwing up (vomiting).  The pain changes and is only in the right or left part of the belly.  You have bloody or tarry looking poop. MAKE SURE YOU:   Understand these instructions.  Will watch your condition.  Will get help right away if you are not doing well or get worse.   This information is not intended to replace advice given to you by your health care provider. Make sure you discuss any questions you have with your health care provider.   Document Released: 12/25/2007 Document Revised: 07/29/2014 Document Reviewed: 03/17/2013 Elsevier Interactive Patient Education 2016 [employer] Your PCP

## 2016-05-31 NOTE — Progress Notes (Signed)
MD ordered patient to be discharged home.  Discharge instructions were reviewed with the patient and he voiced understanding. No follow-up was needed.  Prescriptions given to the patient.  IV was removed with catheter intact.  All patients questions were answered. Patient left via wheelchair escorted by auxillary.

## 2016-05-31 NOTE — Progress Notes (Signed)
Pt stated that his current IV was starting to hurt. Tried starting IV twice without success. Pt stated that he was told he would be discharged tomorrow, however no MD note validates this. Pt wanted to ask MD if he needed a new IV. Dr. Lenord FellersHuglemeyer was notified of pt's request. Told MD D5, NS, with KCl 20 was running and the last K lab was 4.2. MD gave orders to hold fluids for tonight and if MD tomorrow wanted to start them back they could. Will hold fluid.

## 2018-07-29 ENCOUNTER — Other Ambulatory Visit: Payer: Self-pay

## 2018-07-29 ENCOUNTER — Emergency Department: Payer: BLUE CROSS/BLUE SHIELD

## 2018-07-29 ENCOUNTER — Encounter: Payer: Self-pay | Admitting: Emergency Medicine

## 2018-07-29 ENCOUNTER — Emergency Department
Admission: EM | Admit: 2018-07-29 | Discharge: 2018-07-29 | Disposition: A | Discharge status: Home / Self Care | Payer: BLUE CROSS/BLUE SHIELD | Attending: Emergency Medicine | Admitting: Emergency Medicine

## 2018-07-29 DIAGNOSIS — R748 Abnormal levels of other serum enzymes: Secondary | ICD-10-CM | POA: Diagnosis not present

## 2018-07-29 DIAGNOSIS — Z79899 Other long term (current) drug therapy: Secondary | ICD-10-CM | POA: Insufficient documentation

## 2018-07-29 DIAGNOSIS — R109 Unspecified abdominal pain: Secondary | ICD-10-CM

## 2018-07-29 DIAGNOSIS — R1011 Right upper quadrant pain: Secondary | ICD-10-CM | POA: Diagnosis not present

## 2018-07-29 DIAGNOSIS — R7989 Other specified abnormal findings of blood chemistry: Secondary | ICD-10-CM

## 2018-07-29 DIAGNOSIS — R42 Dizziness and giddiness: Secondary | ICD-10-CM | POA: Diagnosis not present

## 2018-07-29 DIAGNOSIS — R945 Abnormal results of liver function studies: Secondary | ICD-10-CM

## 2018-07-29 LAB — CBC
HEMATOCRIT: 45.7 % (ref 39.0–52.0)
Hemoglobin: 15.6 g/dL (ref 13.0–17.0)
MCH: 30.1 pg (ref 26.0–34.0)
MCHC: 34.1 g/dL (ref 30.0–36.0)
MCV: 88.1 fL (ref 80.0–100.0)
Platelets: 312 10*3/uL (ref 150–400)
RBC: 5.19 MIL/uL (ref 4.22–5.81)
RDW: 12.6 % (ref 11.5–15.5)
WBC: 6.7 10*3/uL (ref 4.0–10.5)
nRBC: 0 % (ref 0.0–0.2)

## 2018-07-29 LAB — COMPREHENSIVE METABOLIC PANEL
ALT: 109 U/L — AB (ref 0–44)
AST: 65 U/L — ABNORMAL HIGH (ref 15–41)
Albumin: 4.9 g/dL (ref 3.5–5.0)
Alkaline Phosphatase: 86 U/L (ref 38–126)
Anion gap: 8 (ref 5–15)
BILIRUBIN TOTAL: 1.5 mg/dL — AB (ref 0.3–1.2)
BUN: 15 mg/dL (ref 6–20)
CO2: 25 mmol/L (ref 22–32)
CREATININE: 0.89 mg/dL (ref 0.61–1.24)
Calcium: 9.9 mg/dL (ref 8.9–10.3)
Chloride: 107 mmol/L (ref 98–111)
GFR calc Af Amer: >60 mL/min (ref >60)
Glucose, Bld: 105 mg/dL — ABNORMAL HIGH (ref 70–99)
Potassium: 3.9 mmol/L (ref 3.5–5.1)
Sodium: 140 mmol/L (ref 135–145)
TOTAL PROTEIN: 7.8 g/dL (ref 6.5–8.1)

## 2018-07-29 LAB — URINALYSIS, COMPLETE (UACMP) WITH MICROSCOPIC
BACTERIA UA: NONE SEEN
BILIRUBIN URINE: NEGATIVE
Glucose, UA: NEGATIVE mg/dL
Hgb urine dipstick: NEGATIVE
Ketones, ur: NEGATIVE mg/dL
Leukocytes, UA: NEGATIVE
NITRITE: NEGATIVE
PH: 6 (ref 5.0–8.0)
Protein, ur: NEGATIVE mg/dL
Squamous Epithelial / LPF: NONE SEEN (ref 0–5)

## 2018-07-29 LAB — GLUCOSE, CAPILLARY: Glucose-Capillary: 96 mg/dL (ref 70–99)

## 2018-07-29 LAB — LIPASE, BLOOD: Lipase: 35 U/L (ref 11–51)

## 2018-07-29 MED ORDER — ONDANSETRON HCL 4 MG PO TABS: 4.0000 mg | ORAL_TABLET | Freq: Three times a day (TID) | ORAL | 0 refills | Status: AC | PRN

## 2018-07-29 MED ORDER — MORPHINE SULFATE (PF) 4 MG/ML IV SOLN
4.0000 mg | Freq: Once | INTRAVENOUS | Status: AC
  Administered 2018-07-29: 4 mg via INTRAVENOUS
  Filled 2018-07-29: qty 1

## 2018-07-29 MED ORDER — ONDANSETRON HCL 4 MG/2ML IJ SOLN
4.0000 mg | Freq: Once | INTRAMUSCULAR | Status: AC
  Administered 2018-07-29: 4 mg via INTRAVENOUS
  Filled 2018-07-29: qty 2

## 2018-07-29 MED ORDER — DICYCLOMINE HCL 20 MG PO TABS: 20.0000 mg | ORAL_TABLET | Freq: Three times a day (TID) | ORAL | 0 refills | Status: AC | PRN

## 2018-07-29 MED ORDER — SODIUM CHLORIDE 0.9 % IV BOLUS
1000.0000 mL | Freq: Once | INTRAVENOUS | Status: AC
  Administered 2018-07-29: 1000 mL via INTRAVENOUS

## 2018-07-29 MED ORDER — IOPAMIDOL (ISOVUE-300) INJECTION 61%
100.0000 mL | Freq: Once | INTRAVENOUS | Status: AC | PRN
  Administered 2018-07-29: 100 mL via INTRAVENOUS
  Filled 2018-07-29: qty 100

## 2018-07-29 NOTE — ED Notes (Signed)
Patient transported to CT 

## 2018-07-29 NOTE — ED Provider Notes (Signed)
[facility] Emergency Department Provider Note  ____________________________________________   First MD Initiated Contact with Patient 07/29/18 1935     (approximate)  I have reviewed the triage vital signs and the nursing notes.   HISTORY  Chief Complaint Abdominal Pain and Dizziness   HPI Jon Cooper is a 33 y.o. male with a history of small bowel obstruction as well as ileus who is presenting to the emergency department today complaining of right-sided abdominal pain over the past 2 weeks.  Patient says that the abdominal pain is a 7 out of 10 at this time and sharp.  Says that occasionally will radiate up to his right sided chest.  Says that when the pain radiates and become severe that he will become dizzy.  He denies vomiting or diarrhea.  Says the pain is worse with ambulation.  Says that he came to the emergency department today because of persistent pain keeping him from completing his work.   History reviewed. No pertinent past medical history.  Patient Active Problem List   Diagnosis Date Noted  . SBO (small bowel obstruction) (HCC)   . RUQ abdominal pain   . Ileus (HCC) 05/28/2016    History reviewed. No pertinent surgical history.  Prior to Admission medications   Medication Sig Start Date End Date Taking? Authorizing Provider  cyclobenzaprine (FLEXERIL) 5 MG tablet Take 1 tablet (5 mg total) by mouth 3 (three) times daily as needed for muscle spasms. 05/31/16   Enedina FinnerPatel, Sona, MD  ibuprofen (ADVIL,MOTRIN) 400 MG tablet Take 1 tablet (400 mg total) by mouth every 8 (eight) hours as needed for headache. 05/31/16   Enedina FinnerPatel, Sona, MD  omeprazole (PRILOSEC) 40 MG capsule Take 40 mg by mouth daily.    [provider]  sucralfate (CARAFATE) 1 g tablet Take 1 g by mouth 4 (four) times daily.    [provider]    Allergies Patient has no known allergies.  No family history on file.  Social History Social History   Tobacco  Use  . Smoking status: Never Smoker  . Smokeless tobacco: Never Used  Substance Use Topics  . Alcohol use: No  . Drug use: No    Review of Systems Constitutional: No fever/chills Eyes: No visual changes. ENT: No sore throat. Cardiovascular: Denies chest pain. Respiratory: Denies shortness of breath. Gastrointestinal:   No constipation. Genitourinary: Negative for dysuria. Musculoskeletal: Negative for back pain. Skin: Negative for rash. Neurological: Negative for headaches, focal weakness or numbness.   ____________________________________________   PHYSICAL EXAM:  VITAL SIGNS: ED Triage Vitals  Enc Vitals Group     BP 07/29/18 1356 (!) 162/86     Pulse Rate 07/29/18 1356 92     Resp 07/29/18 1356 20     Temp 07/29/18 1356 98 F (36.7 C)     Temp Source 07/29/18 1356 Oral     SpO2 07/29/18 1356 97 %     Weight 07/29/18 1357 240 lb (108.9 kg)     Height 07/29/18 1357 5\' 8"  (1.727 m)     Head Circumference --      Peak Flow --      Pain Score 07/29/18 1400 7     Pain Loc --      Pain Edu? --      Excl. in GC? --     Constitutional: Alert and oriented. Well appearing and in no acute distress. Eyes: Conjunctivae are normal.  Head: Atraumatic. Nose: No congestion/rhinnorhea. Mouth/Throat: Mucous membranes are  moist.  Neck: No stridor.   Cardiovascular: Normal rate, regular rhythm. Grossly normal heart sounds.   Respiratory: Normal respiratory effort.  No retractions. Lungs CTAB. Gastrointestinal: Soft with moderate tenderness palpation of the right lower as well as right upper quadrant without rebound or guarding.  No masses palpated.  No distention. No CVA tenderness. Musculoskeletal: No lower extremity tenderness nor edema.  No joint effusions. Neurologic:  Normal speech and language. No gross focal neurologic deficits are appreciated. Skin:  Skin is warm, dry and intact. No rash noted. Psychiatric: Mood and affect are normal. Speech and behavior are  normal.  ____________________________________________   LABS (all labs ordered are listed, but only abnormal results are displayed)  Labs Reviewed  COMPREHENSIVE METABOLIC PANEL - Abnormal; Notable for the following components:      Result Value   Glucose, Bld 105 (*)    AST 65 (*)    ALT 109 (*)    Total Bilirubin 1.5 (*)    All other components within normal limits  URINALYSIS, COMPLETE (UACMP) WITH MICROSCOPIC - Abnormal; Notable for the following components:   Color, Urine STRAW (*)    APPearance CLEAR (*)    Specific Gravity, Urine >1.046 (*)    All other components within normal limits  LIPASE, BLOOD  CBC  GLUCOSE, CAPILLARY   ____________________________________________  EKG  ED ECG REPORT I, Arelia Longest, the attending physician, personally viewed and interpreted this ECG.   Date: 07/29/2018  EKG Time: 1402  Rate: 77  Rhythm: normal sinus rhythm  Axis: Normal  Intervals:none  ST&T Change: No ST segment elevation or depression.  No abnormal T wave inversion.  ____________________________________________  RADIOLOGY  CT abdomen without evidence of bowel obstruction.  Normal appendix.  No CT findings to account for patient's right-sided abdominal pain. ____________________________________________   PROCEDURES  Procedure(s) performed:   Procedures  Critical Care performed:   ____________________________________________   INITIAL IMPRESSION / ASSESSMENT AND PLAN / ED COURSE  Pertinent labs & imaging results that were available during my care of the patient were reviewed by me and considered in my medical decision making (see chart for details).  Differential diagnosis includes, but is not limited to, biliary disease (biliary colic, acute cholecystitis, cholangitis, choledocholithiasis, etc), intrathoracic causes for epigastric abdominal pain including ACS, gastritis, duodenitis, pancreatitis, small bowel or large bowel obstruction, abdominal  aortic aneurysm, hernia, and ulcer(s). Differential diagnosis includes, but is not limited to, acute appendicitis, renal colic, testicular torsion, urinary tract infection/pyelonephritis, prostatitis,  epididymitis, diverticulitis, small bowel obstruction or ileus, colitis, abdominal aortic aneurysm, gastroenteritis, hernia, etc. As part of my medical decision making, I reviewed the following data within the electronic MEDICAL RECORD NUMBER Notes from prior ED visits  ----------------------------------------- 10:49 PM on 07/29/2018 -----------------------------------------  At the time resting without any distress.  Reassuring CAT scan as well as lab work-up.  Slightly elevated LFTs.  However, this appears somewhat chronic.  Patient will be discharged for follow-up with gastroenterology.  Will discharge with Zofran as well as Bentyl.  He is aware of the diagnosis well treatment plan and willing to comply. ____________________________________________   FINAL CLINICAL IMPRESSION(S) / ED DIAGNOSES  Right-sided abdominal pain.  Elevated LFTs.  NEW MEDICATIONS STARTED DURING THIS VISIT:  New Prescriptions   No medications on file     Note:  This document was prepared using Dragon voice recognition software and may include unintentional dictation errors.     Myrna Blazer, MD 07/29/18 2249

## 2018-07-29 NOTE — ED Triage Notes (Signed)
Pt arrives with complaints of right lower abdominal pain that radiates to his chest. Pt also intermittent episodes of dizziness that started 2 weeks prior. PT reports the pain in his lower abdomen feels likes a sharp jabbing and is only present after a bowel movement. Pt denies n/vd/.

## 2020-04-29 IMAGING — CT CT ABD-PELV W/ CM
2 of 4 series · 16 of 46 positions shown, 18 images · IV contrast (APPLIED)
Comparison: 05/28/2016

CLINICAL DATA: Right lower abdominal pain

EXAM:
CT ABDOMEN AND PELVIS WITH CONTRAST
TECHNIQUE: Multidetector CT imaging of the abdomen and pelvis was performed
using the standard protocol following bolus administration of
intravenous contrast.
CONTRAST:  100mL [id number] IOPAMIDOL ([id number]) INJECTION 61%

[Series 2: routine abd/pel with · axial · 0.90mm/px · z∈[-206,+279]mm · 13 of 107 slices shown, 15 images]
[im 5/107  soft-tissue]
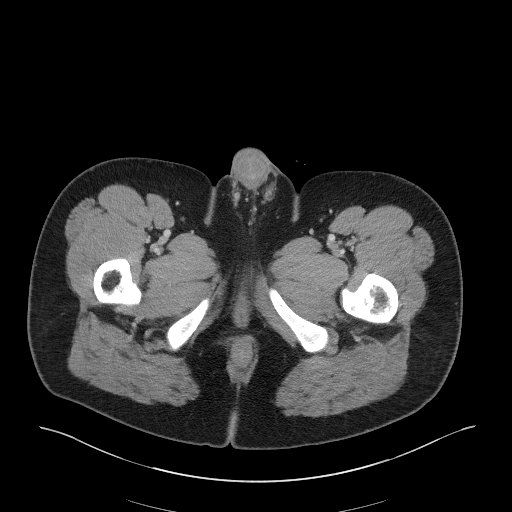
[im 5/107  bone]
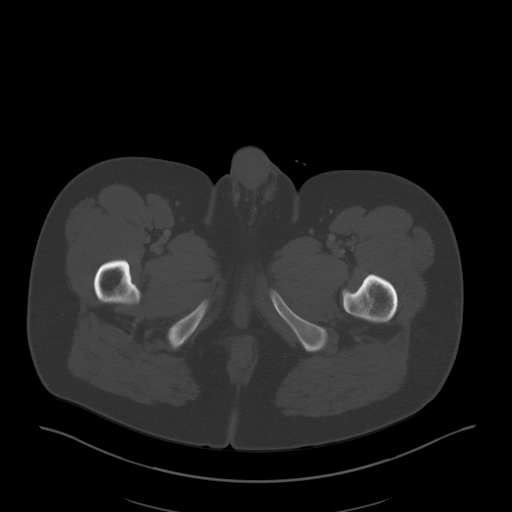
[im 13/107  soft-tissue]
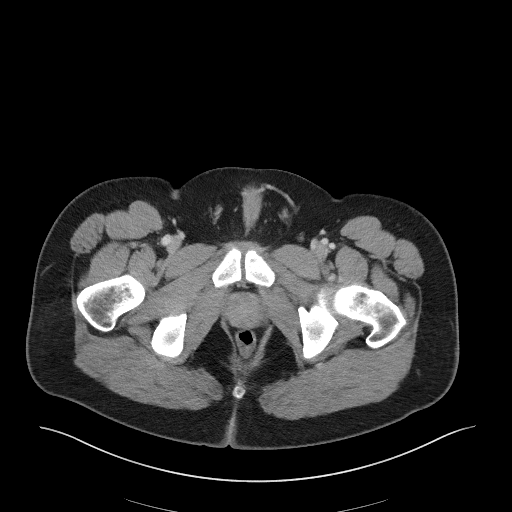
[im 22/107  soft-tissue]
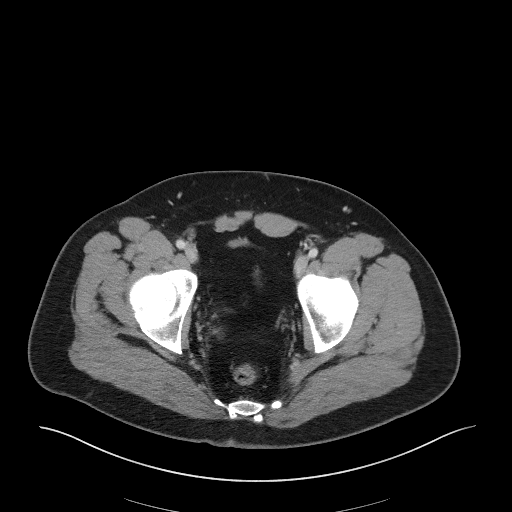
[im 30/107  soft-tissue]
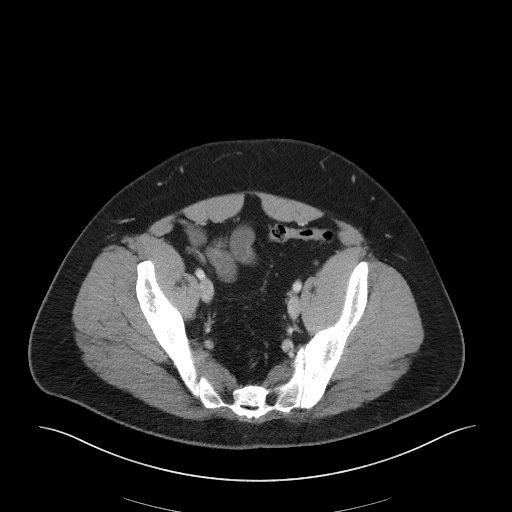
[im 39/107  soft-tissue]
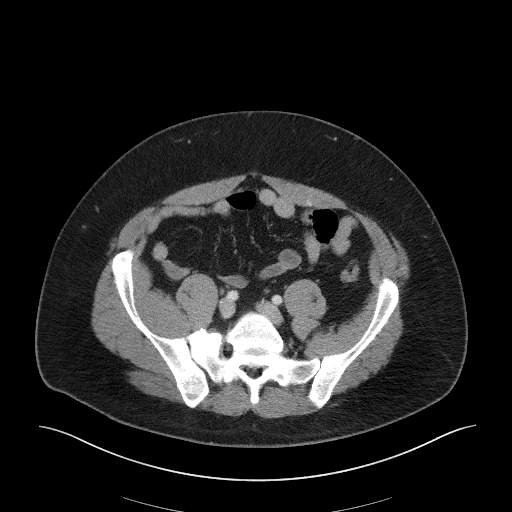
[im 47/107  soft-tissue]
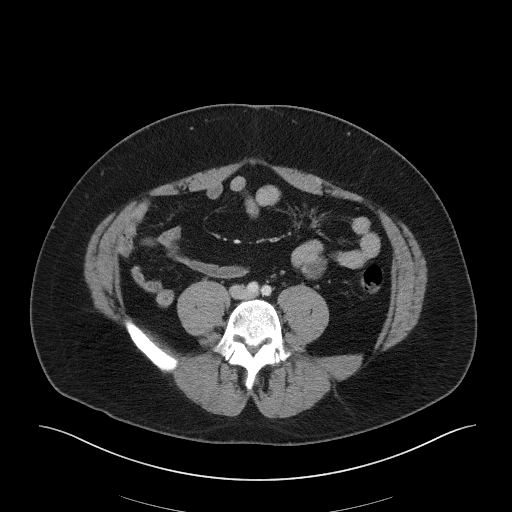
[im 56/107  soft-tissue]
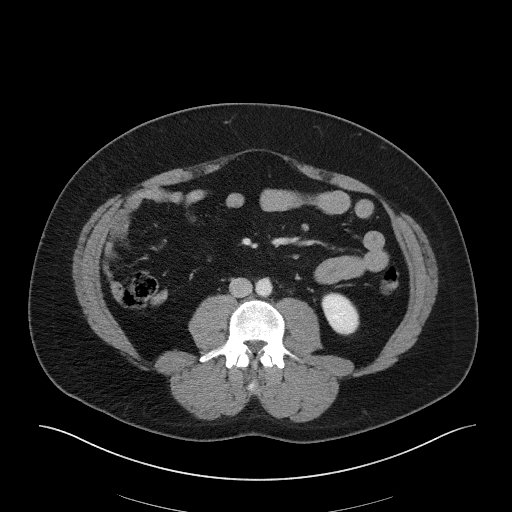
[im 60/107  soft-tissue]
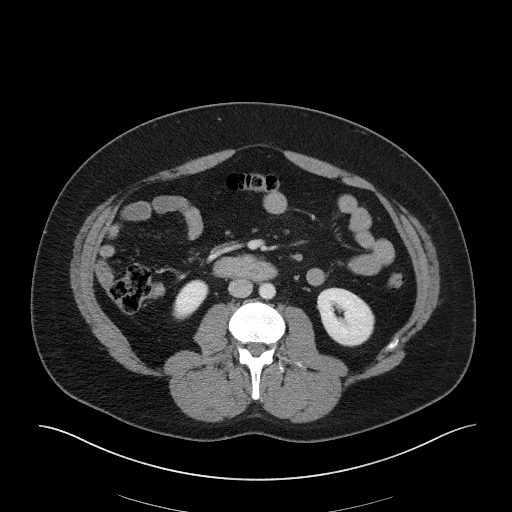
[im 68/107  soft-tissue]
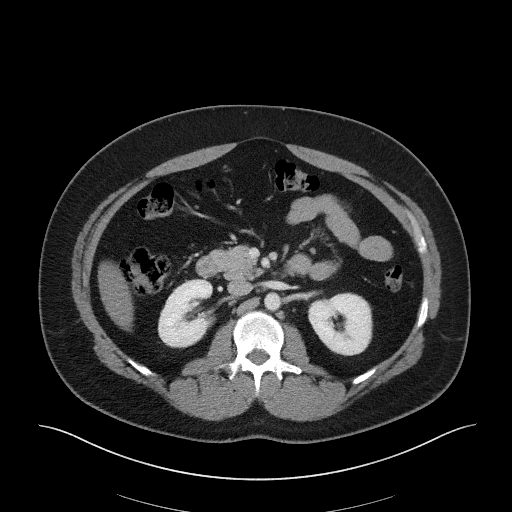
[im 68/107  bone]
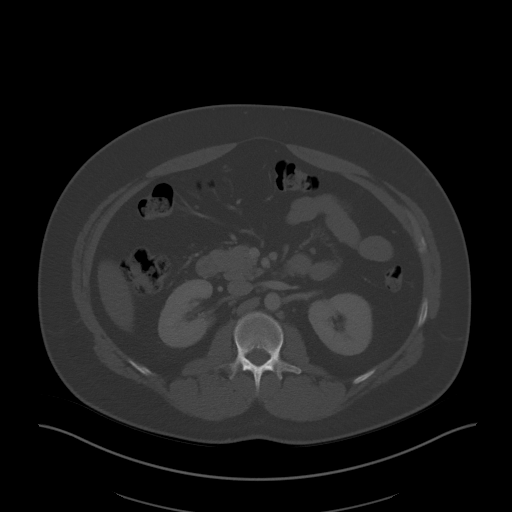
[im 77/107  soft-tissue]
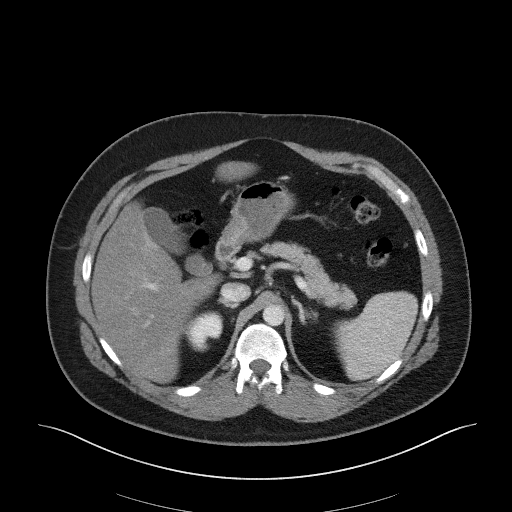
[im 85/107  soft-tissue]
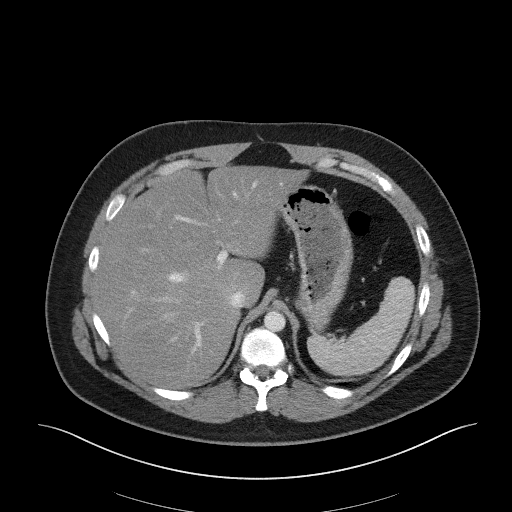
[im 94/107  soft-tissue]
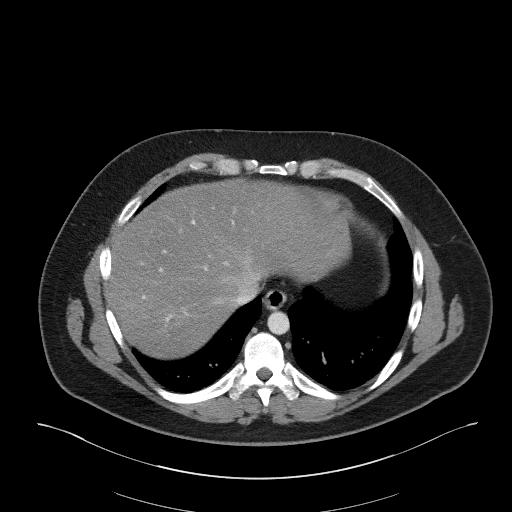
[im 102/107  soft-tissue]
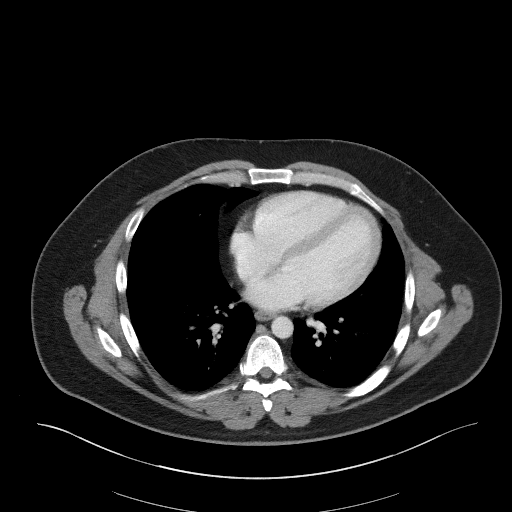

[Series 5: coronal st · coronal · 0.85mm/px · 3 of 98 slices shown]
[im 33/98  soft-tissue]
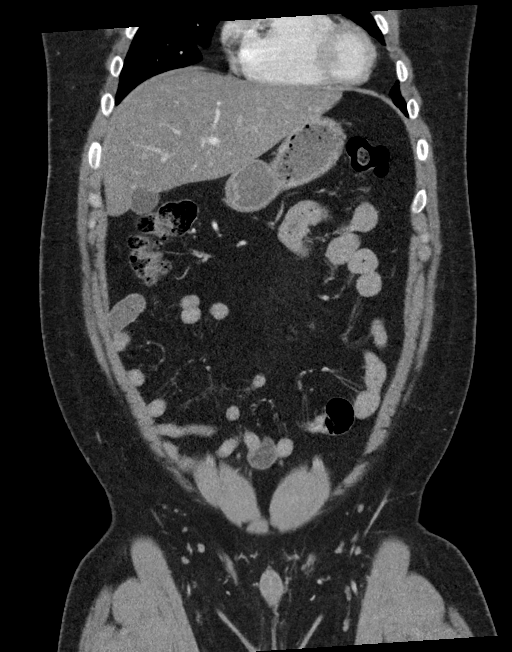
[im 44/98  soft-tissue]
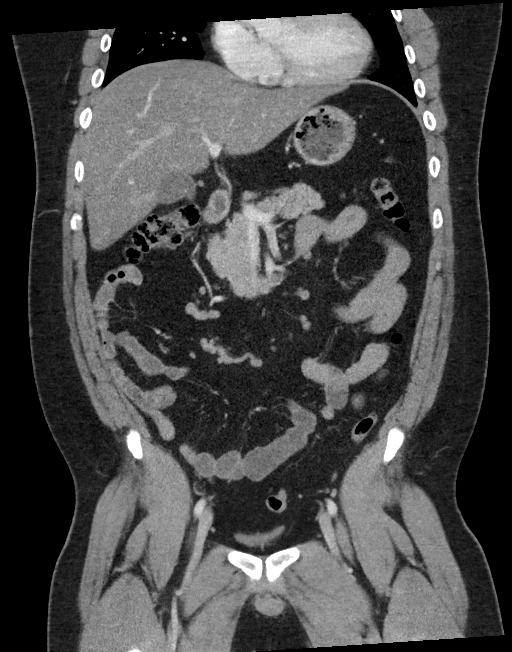
[im 54/98  soft-tissue]
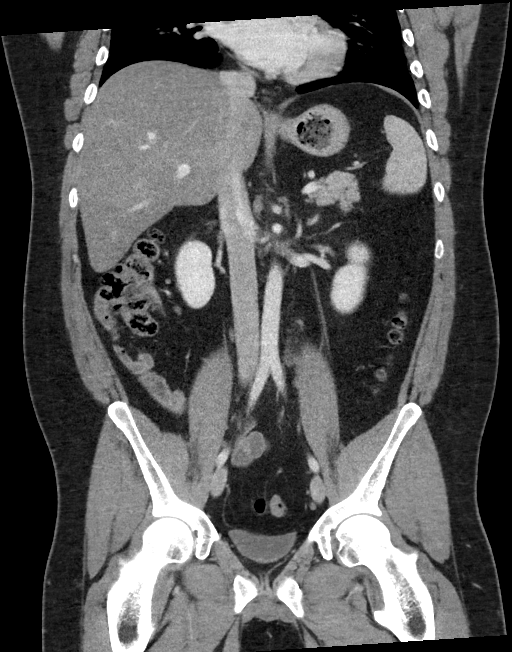

[16 of 46 positions shown; findings below may reference images not displayed]

FINDINGS: Lower chest: Lung bases are clear.

Hepatobiliary: Hepatic steatosis with focal fatty sparing along the
gallbladder fossa.

Gallbladder is unremarkable. No intrahepatic or extrahepatic ductal
dilatation.

Pancreas: Within normal limits.

Spleen: Within normal limits.

Adrenals/Urinary Tract: Adrenal glands are within normal limits.

Kidneys are within normal limits.  No hydronephrosis.

Bladder is underdistended but unremarkable.

Stomach/Bowel: Stomach is within normal limits.

No evidence of bowel obstruction.

Normal appendix (series 2/image 57).

Vascular/Lymphatic: No evidence of abdominal aortic aneurysm.

No suspicious abdominopelvic lymphadenopathy.

Reproductive: Prostate is unremarkable.

Other: No abdominopelvic ascites.

Musculoskeletal: Visualized osseous structures are within normal
limits.
IMPRESSION: No evidence of bowel obstruction.  Normal appendix.

Hepatic steatosis.

No CT findings to account for the patient's right abdominal pain.
